# Patient Record
Sex: Female | Born: 2000
Health system: Southern US, Community
[De-identification: ages and names within clinical notes are randomized; demographics above are authoritative.]

## PROBLEM LIST (undated history)

## (undated) ENCOUNTER — Ambulatory Visit (HOSPITAL_COMMUNITY): Admission: EM | Payer: Self-pay

## (undated) DIAGNOSIS — S83511A Sprain of anterior cruciate ligament of right knee, initial encounter: Secondary | ICD-10-CM

## (undated) DIAGNOSIS — S83519A Sprain of anterior cruciate ligament of unspecified knee, initial encounter: Secondary | ICD-10-CM

---
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
## 2020-06-15 ENCOUNTER — Other Ambulatory Visit: Payer: Self-pay

## 2020-06-15 ENCOUNTER — Emergency Department (HOSPITAL_COMMUNITY)
Admission: EM | Admit: 2020-06-15 | Discharge: 2020-06-16 | Disposition: A | Discharge status: Home / Self Care | Payer: Medicaid Other | Attending: Emergency Medicine | Admitting: Emergency Medicine

## 2020-06-15 DIAGNOSIS — Z5321 Procedure and treatment not carried out due to patient leaving prior to being seen by health care provider: Secondary | ICD-10-CM | POA: Insufficient documentation

## 2020-06-15 DIAGNOSIS — R519 Headache, unspecified: Secondary | ICD-10-CM | POA: Diagnosis not present

## 2020-06-15 DIAGNOSIS — R1110 Vomiting, unspecified: Secondary | ICD-10-CM | POA: Diagnosis not present

## 2020-06-15 LAB — URINALYSIS, ROUTINE W REFLEX MICROSCOPIC
Bilirubin Urine: NEGATIVE
Glucose, UA: NEGATIVE mg/dL
Hgb urine dipstick: NEGATIVE
Ketones, ur: 80 mg/dL — AB
Leukocytes,Ua: NEGATIVE
Nitrite: NEGATIVE
Protein, ur: 30 mg/dL — AB
Specific Gravity, Urine: 1.033 — ABNORMAL HIGH (ref 1.005–1.030)
pH: 5 (ref 5.0–8.0)

## 2020-06-15 LAB — LIPASE, BLOOD: Lipase: 20 U/L (ref 11–51)

## 2020-06-15 LAB — COMPREHENSIVE METABOLIC PANEL
ALT: 12 U/L (ref 0–44)
AST: 15 U/L (ref 15–41)
Albumin: 4.1 g/dL (ref 3.5–5.0)
Alkaline Phosphatase: 46 U/L (ref 38–126)
Anion gap: 12 (ref 5–15)
BUN: 14 mg/dL (ref 6–20)
CO2: 21 mmol/L — ABNORMAL LOW (ref 22–32)
Calcium: 9.2 mg/dL (ref 8.9–10.3)
Chloride: 100 mmol/L (ref 98–111)
Creatinine, Ser: 0.91 mg/dL (ref 0.44–1.00)
GFR, Estimated: >60 mL/min (ref >60)
Glucose, Bld: 93 mg/dL (ref 70–99)
Potassium: 3.3 mmol/L — ABNORMAL LOW (ref 3.5–5.1)
Sodium: 133 mmol/L — ABNORMAL LOW (ref 135–145)
Total Bilirubin: 0.6 mg/dL (ref 0.3–1.2)
Total Protein: 6.8 g/dL (ref 6.5–8.1)

## 2020-06-15 LAB — CBC
HCT: 40.8 % (ref 36.0–46.0)
Hemoglobin: 13.7 g/dL (ref 12.0–15.0)
MCH: 32.7 pg (ref 26.0–34.0)
MCHC: 33.6 g/dL (ref 30.0–36.0)
MCV: 97.4 fL (ref 80.0–100.0)
Platelets: 196 10*3/uL (ref 150–400)
RBC: 4.19 MIL/uL (ref 3.87–5.11)
RDW: 11.5 % (ref 11.5–15.5)
WBC: 3.8 10*3/uL — ABNORMAL LOW (ref 4.0–10.5)
nRBC: 0 % (ref 0.0–0.2)

## 2020-06-15 LAB — I-STAT BETA HCG BLOOD, ED (MC, WL, AP ONLY): I-stat hCG, quantitative: <5 m[IU]/mL (ref <5)

## 2020-06-15 NOTE — ED Triage Notes (Signed)
Pt presents to ED POV. Pt c/o emesis and HA began this am. Pt denies any other s/s. Denies pain. LMP was 1w ago

## 2020-06-16 NOTE — ED Notes (Signed)
Pt called for vitals x3 no response 

## 2021-02-18 ENCOUNTER — Emergency Department (HOSPITAL_COMMUNITY): Payer: Medicaid Other

## 2021-02-18 ENCOUNTER — Emergency Department (HOSPITAL_COMMUNITY)
Admission: EM | Admit: 2021-02-18 | Discharge: 2021-02-18 | Disposition: A | Discharge status: Home / Self Care | Payer: Medicaid Other | Attending: Emergency Medicine | Admitting: Emergency Medicine

## 2021-02-18 ENCOUNTER — Other Ambulatory Visit: Payer: Self-pay

## 2021-02-18 ENCOUNTER — Encounter (HOSPITAL_COMMUNITY): Payer: Self-pay | Admitting: Emergency Medicine

## 2021-02-18 DIAGNOSIS — N12 Tubulo-interstitial nephritis, not specified as acute or chronic: Secondary | ICD-10-CM | POA: Diagnosis not present

## 2021-02-18 DIAGNOSIS — R1011 Right upper quadrant pain: Secondary | ICD-10-CM | POA: Diagnosis present

## 2021-02-18 LAB — COMPREHENSIVE METABOLIC PANEL
ALT: 10 U/L (ref 0–44)
AST: 13 U/L — ABNORMAL LOW (ref 15–41)
Albumin: 3.6 g/dL (ref 3.5–5.0)
Alkaline Phosphatase: 53 U/L (ref 38–126)
Anion gap: 7 (ref 5–15)
BUN: 15 mg/dL (ref 6–20)
CO2: 23 mmol/L (ref 22–32)
Calcium: 9.5 mg/dL (ref 8.9–10.3)
Chloride: 107 mmol/L (ref 98–111)
Creatinine, Ser: 0.78 mg/dL (ref 0.44–1.00)
GFR, Estimated: >60 mL/min (ref >60)
Glucose, Bld: 92 mg/dL (ref 70–99)
Potassium: 4.3 mmol/L (ref 3.5–5.1)
Sodium: 137 mmol/L (ref 135–145)
Total Bilirubin: 0.5 mg/dL (ref 0.3–1.2)
Total Protein: 6.7 g/dL (ref 6.5–8.1)

## 2021-02-18 LAB — CBC
HCT: 41.3 % (ref 36.0–46.0)
Hemoglobin: 13.1 g/dL (ref 12.0–15.0)
MCH: 31 pg (ref 26.0–34.0)
MCHC: 31.7 g/dL (ref 30.0–36.0)
MCV: 97.6 fL (ref 80.0–100.0)
Platelets: 256 10*3/uL (ref 150–400)
RBC: 4.23 MIL/uL (ref 3.87–5.11)
RDW: 12.1 % (ref 11.5–15.5)
WBC: 8.3 10*3/uL (ref 4.0–10.5)
nRBC: 0 % (ref 0.0–0.2)

## 2021-02-18 LAB — URINALYSIS, ROUTINE W REFLEX MICROSCOPIC
Bacteria, UA: NONE SEEN
Bilirubin Urine: NEGATIVE
Glucose, UA: NEGATIVE mg/dL
Ketones, ur: NEGATIVE mg/dL
Nitrite: NEGATIVE
Protein, ur: NEGATIVE mg/dL
Specific Gravity, Urine: 1.01 (ref 1.005–1.030)
WBC, UA: >50 WBC/hpf — ABNORMAL HIGH (ref 0–5)
pH: 8 (ref 5.0–8.0)

## 2021-02-18 LAB — I-STAT BETA HCG BLOOD, ED (MC, WL, AP ONLY): I-stat hCG, quantitative: <5 m[IU]/mL (ref <5)

## 2021-02-18 LAB — LIPASE, BLOOD: Lipase: 23 U/L (ref 11–51)

## 2021-02-18 MED ORDER — CEPHALEXIN 500 MG PO CAPS: 500.0000 mg | ORAL_CAPSULE | Freq: Three times a day (TID) | ORAL | 0 refills | Status: AC

## 2021-02-18 MED ORDER — ACETAMINOPHEN 325 MG PO TABS: 650.0000 mg | ORAL_TABLET | Freq: Four times a day (QID) | ORAL | 0 refills | Status: DC | PRN

## 2021-02-18 MED ORDER — CEPHALEXIN 250 MG PO CAPS
500.0000 mg | ORAL_CAPSULE | Freq: Once | ORAL | Status: AC
  Administered 2021-02-18: 500 mg via ORAL
  Filled 2021-02-18: qty 2

## 2021-02-18 MED ORDER — IOHEXOL 350 MG/ML SOLN
80.0000 mL | Freq: Once | INTRAVENOUS | Status: AC | PRN
  Administered 2021-02-18: 80 mL via INTRAVENOUS

## 2021-02-18 MED ORDER — SODIUM CHLORIDE 0.9 % IV BOLUS
1000.0000 mL | Freq: Once | INTRAVENOUS | Status: AC
  Administered 2021-02-18: 1000 mL via INTRAVENOUS

## 2021-02-18 MED ORDER — KETOROLAC TROMETHAMINE 15 MG/ML IJ SOLN
15.0000 mg | Freq: Once | INTRAMUSCULAR | Status: AC
  Administered 2021-02-18: 15 mg via INTRAVENOUS
  Filled 2021-02-18: qty 1

## 2021-02-18 NOTE — ED Triage Notes (Signed)
C/o RLQ pain since Friday.  Pt crying.  Denies nausea, vomiting, and diarrhea.

## 2021-02-18 NOTE — ED Notes (Signed)
Patient transported to CT 

## 2021-02-18 NOTE — ED Provider Notes (Signed)
[facility] EMERGENCY DEPARTMENT Provider Note   CSN: [id number] Arrival date & time: 02/18/21  3149     History No chief complaint on file.   Jasmin [name] is a 20 y.o. female.  Pt is a 20 yo female presenting for abdominal pain. Pt admits to RLQ abdominal pain with radiation to the right flank region x 2.5 days, worsening, severe, and associated with nausea and vomiting. Denies fevers or chills. Admits to dysuria and increased frequency.   The history is provided by the patient. No language interpreter was used.  Abdominal Pain Pain location:  RUQ Pain quality: aching   Pain radiates to:  R flank Chronicity:  New Associated symptoms: dysuria, nausea and vomiting   Associated symptoms: no chest pain, no chills, no cough, no fever, no hematuria, no shortness of breath and no sore throat       Past Medical History:  Diagnosis Date   ACL tear 03/2015   right   Complete tear of right ACL 04/09/2015    Patient Active Problem List   Diagnosis Date Noted   Complete tear of right ACL 04/09/2015    Past Surgical History:  Procedure Laterality Date   KNEE ARTHROSCOPY WITH ANTERIOR CRUCIATE LIGAMENT (ACL) REPAIR WITH HAMSTRING GRAFT Right 04/12/2015   Procedure: RIGHT KNEE ARTHROSCOPY WITH ANTERIOR CRUCIATE LIGAMENT (ACL) REPAIR WITH HAMSTRING AUTOGRAFT;  Surgeon: Salvatore Marvel, MD;  Location: [facility];  Service: Orthopedics;  Laterality: Right;  ANESTHESIA:  GENERAL, PRE/POST OP FEMORAL NERVE     OB History   No obstetric history on file.     No family history on file.  Social History   Tobacco Use   Smoking status: Never   Smokeless tobacco: Never  Vaping Use   Vaping Use: Never used  Substance Use Topics   Alcohol use: No   Drug use: No    Home Medications Prior to Admission medications   Medication Sig Start Date End Date Taking? Authorizing Provider  clindamycin (CLEOCIN) 150 MG capsule Take 1 capsule (150 mg total)  every 6 (six) hours by mouth. 04/22/17   Blane Ohara, MD  diazepam (VALIUM) 2 MG tablet Take 1 tablet (2 mg total) by mouth every 8 (eight) hours as needed for muscle spasms. 04/12/15   Shepperson, Kirstin, PA-C  oxyCODONE (ROXICODONE) 5 MG immediate release tablet 1-2 tablets every 4-6 hrs as needed for pain 04/12/15   Shepperson, Kirstin, PA-C    Allergies    Patient has no known allergies.  Review of Systems   Review of Systems  Constitutional:  Negative for chills and fever.  HENT:  Negative for ear pain and sore throat.   Eyes:  Negative for pain and visual disturbance.  Respiratory:  Negative for cough and shortness of breath.   Cardiovascular:  Negative for chest pain and palpitations.  Gastrointestinal:  Positive for abdominal pain, nausea and vomiting.  Genitourinary:  Positive for dysuria, flank pain and frequency. Negative for hematuria.  Musculoskeletal:  Negative for arthralgias and back pain.  Skin:  Negative for color change and rash.  Neurological:  Negative for seizures and syncope.  All other systems reviewed and are negative.  Physical Exam Updated Vital Signs BP 94/60 (BP Location: Right Arm)   Pulse (!) 110   Temp 98.1 F (36.7 C) (Oral)   Resp 16   Ht 5\' 6"  (1.676 m)   Wt 51.7 kg   LMP 01/28/2021   SpO2 100%   BMI 18.40 kg/m  Physical Exam Vitals and nursing note reviewed.  Constitutional:      General: She is not in acute distress.    Appearance: She is well-developed.  HENT:     Head: Normocephalic and atraumatic.  Eyes:     Conjunctiva/sclera: Conjunctivae normal.  Cardiovascular:     Rate and Rhythm: Normal rate and regular rhythm.     Heart sounds: No murmur heard. Pulmonary:     Effort: Pulmonary effort is normal. No respiratory distress.     Breath sounds: Normal breath sounds.  Abdominal:     Palpations: Abdomen is soft.     Tenderness: There is no abdominal tenderness.  Musculoskeletal:     Cervical back: Neck supple.  Skin:     General: Skin is warm and dry.  Neurological:     Mental Status: She is alert.    ED Results / Procedures / Treatments   Labs (all labs ordered are listed, but only abnormal results are displayed) Labs Reviewed  LIPASE, BLOOD  COMPREHENSIVE METABOLIC PANEL  CBC  URINALYSIS, ROUTINE W REFLEX MICROSCOPIC  I-STAT BETA HCG BLOOD, ED ([facility], [facility], [facility] ONLY)    EKG None  Radiology No results found.  Procedures Procedures   Medications Ordered in ED Medications - No data to display  ED Course  I have reviewed the triage vital signs and the nursing notes.  Pertinent labs & imaging results that were available during my care of the patient were reviewed by me and considered in my medical decision making (see chart for details).    MDM Rules/Calculators/A&P                           10:29 AM 20 yo female presenting for dysuria, increased urinary frequency, RLQ abdominal pain, and right flank pain. Pt is Aox3, no acute distress, afebrile, with stable vitals. Physical exam demonstrates soft non-peritoneal abdomen.   UA demonstrates UTI with CT evidence of hydronephrosis likely secondary to pyelonephritis. No signs/symptoms of sepsis. Pt able to urinate without difficulty. Keflex given in ED and sent to pharmacy.   Negative pregnancy test CT demonstrates no appendicitis.   Patient in no distress and overall condition improved here in the ED. Detailed discussions were had with the patient regarding current findings, and need for close f/u with PCP or on call doctor. The patient has been instructed to return immediately if the symptoms worsen in any way for re-evaluation. Patient verbalized understanding and is in agreement with current care plan. All questions answered prior to discharge.       Final Clinical Impression(s) / ED Diagnoses Final diagnoses:  Pyelonephritis    Rx / DC Orders ED Discharge Orders     None        Franne Forts, DO 02/18/21 1258

## 2021-07-29 ENCOUNTER — Encounter (HOSPITAL_COMMUNITY): Payer: Self-pay

## 2021-07-29 ENCOUNTER — Other Ambulatory Visit: Payer: Self-pay

## 2021-07-29 ENCOUNTER — Ambulatory Visit (HOSPITAL_COMMUNITY)
Admission: EM | Admit: 2021-07-29 | Discharge: 2021-07-29 | Disposition: A | Discharge status: Home / Self Care | Payer: Medicaid Other | Attending: Emergency Medicine | Admitting: Emergency Medicine

## 2021-07-29 DIAGNOSIS — J038 Acute tonsillitis due to other specified organisms: Secondary | ICD-10-CM

## 2021-07-29 DIAGNOSIS — B9689 Other specified bacterial agents as the cause of diseases classified elsewhere: Secondary | ICD-10-CM | POA: Diagnosis not present

## 2021-07-29 LAB — POCT RAPID STREP A, ED / UC: Streptococcus, Group A Screen (Direct): NEGATIVE

## 2021-07-29 MED ORDER — CEFDINIR 300 MG PO CAPS: 300.0000 mg | ORAL_CAPSULE | Freq: Two times a day (BID) | ORAL | 0 refills | Status: AC

## 2021-07-29 NOTE — Discharge Instructions (Addendum)
Based on my physical exam today and the history that you provided, I believe that you have bacterial pharyngitis.  Bacterial pharyngitis is most commonly caused by bacteria called group A Streptococcus but there are many other culprits.   Your rapid strep test today is negative, per protocol, throat culture will be performed and the results should be available in the next 3 to 5 days.  The rapid strep test that we performed in the office only catches 40% of known cases of group A streptococcal pharyngitis.  Additionally, and unfortunately, the throat culture that we perform here in the urgent care only evaluates for group A strep and not any of the other bacteria  that are known to cause bacterial pharyngitis.      Because you have significant swelling of both tonsils, significant redness of both tonsils and white patches on both tonsils, I have prescribed Cefdinir 300 mg twice daily for 10 days to treat you for bacterial pharyngitis.  Cefdinir covers group A strep along with other known causative organisms.  Please take this antibiotic as prescribed and do not skip any doses.  Once you have been on antibiotics for a full 24 hours, you are no longer considered contagious.       If you receive a phone call telling you that your throat culture is negative, I still strongly recommend that you complete your entire prescription of antibiotics regardless of the result of your throat culture, particularly if you begin to feel better within the first 48 hours of therapy.  The throat culture we perform here at urgent care only tests for group A strep and not any of the other bacteria that can also cause bacterial pharyngitis.  Please follow-up with your primary care provider in the next week to ten days for repeat evaluation if you have not had complete resolution of your symptoms.      Please see the list below for recommended medications, dosages and frequencies to provide relief of your current symptoms:      Ibuprofen  (Advil, Motrin): This is a good anti-inflammatory medication which addresses aches, pains and inflammation of the upper airways that causes sinus and nasal congestion as well as in the lower airways which makes your cough feel tight and sometimes burn.  I recommend that you take between 400 to 600 mg every 6-8 hours as needed.      Chloraseptic Throat Spray: Spray 5 sprays into affected area every 2 hours, hold for 15 seconds and either swallow or spit it out.  This is a excellent numbing medication because it is a spray, you can put it right where you needed and so sucking on a lozenge and numbing your entire mouth.      Please remain home from work, school, public places until you have been fever free for 24 hours without the use of antifever medications such as Tylenol or ibuprofen.    Please follow-up within the next 3 to 5 days either with your primary care provider or urgent care if your symptoms do not resolve.  If you do not have a primary care provider, we will assist you in finding one.

## 2021-07-29 NOTE — ED Triage Notes (Signed)
Pt presents with c/o sore throat and cough X 2 days.    Pt states today she has been taking coughs drops and states she felt her throat was closing.

## 2021-07-29 NOTE — ED Provider Notes (Signed)
[facility]    CSN: [id number] Arrival date & time: 07/29/21  1643    HISTORY   Chief Complaint  Patient presents with   Sore Throat   Cough   HPI Jasmin [name] is a 21 y.o. female. Patient presents with a 2-day history of sore throat and cough.  Patient states she has been using cough drops with little relief, states she feels like her throat is swelling.  Patient is afebrile on arrival today with normal blood pressure, normal heart rate, normal pulse and normal oxygen saturation.   Past Medical History:  Diagnosis Date   ACL tear 03/2015   right   Complete tear of right ACL 04/09/2015   Patient Active Problem List   Diagnosis Date Noted   Complete tear of right ACL 04/09/2015   Past Surgical History:  Procedure Laterality Date   KNEE ARTHROSCOPY WITH ANTERIOR CRUCIATE LIGAMENT (ACL) REPAIR WITH HAMSTRING GRAFT Right 04/12/2015   Procedure: RIGHT KNEE ARTHROSCOPY WITH ANTERIOR CRUCIATE LIGAMENT (ACL) REPAIR WITH HAMSTRING AUTOGRAFT;  Surgeon: Salvatore Marvel, MD;  Location: [facility];  Service: Orthopedics;  Laterality: Right;  ANESTHESIA:  GENERAL, PRE/POST OP FEMORAL NERVE   OB History   No obstetric history on file.    Home Medications    Prior to Admission medications   Medication Sig Start Date End Date Taking? Authorizing Provider   Family History History reviewed. No pertinent family history. Social History Social History   Tobacco Use   Smoking status: Never   Smokeless tobacco: Never  Vaping Use   Vaping Use: Never used  Substance Use Topics   Alcohol use: No   Drug use: No   Allergies   Patient has no known allergies.  Review of Systems Review of Systems Pertinent findings noted in history of present illness.   Physical Exam Triage Vital Signs ED Triage Vitals  Enc Vitals Group     BP 04/13/21 0827 (!) 147/82     Pulse Rate 04/13/21 0827 72     Resp 04/13/21 0827 18     Temp 04/13/21 0827 98.3 F (36.8 C)      Temp Source 04/13/21 0827 Oral     SpO2 04/13/21 0827 98 %     Weight --      Height --      Head Circumference --      Peak Flow --      Pain Score 04/13/21 0826 5     Pain Loc --      Pain Edu? --      Excl. in GC? --   No data found.  Updated Vital Signs BP 105/73 (BP Location: Left Arm)    Pulse 70    Temp 99.3 F (37.4 C) (Oral)    Resp 17    LMP 07/24/2021 (Exact Date)    SpO2 96%   Physical Exam  Visual Acuity Right Eye Distance:   Left Eye Distance:   Bilateral Distance:    Right Eye Near:   Left Eye Near:    Bilateral Near:     UC Couse / Diagnostics / Procedures:    EKG  Radiology No results found.  Procedures Procedures (including critical care time)  UC Diagnoses / Final Clinical Impressions(s)   I have reviewed the triage vital signs and the nursing notes.  Pertinent labs & imaging results that were available during my care of the patient were reviewed by me and considered in my medical decision making (see chart  for details).   Final diagnoses:  Acute bacterial tonsillitis  Patient will be treated empirically for presumed bacterial tonsillitis despite negative rapid strep test.  Throat culture will be performed per protocol.  Patient advised that if she is feeling better after few days of antibiotics, she should continue full 10-day course.  Return precautions advised.  ED Prescriptions     Medication Sig Dispense Auth. Provider   cefdinir (OMNICEF) 300 MG capsule Take 1 capsule (300 mg total) by mouth 2 (two) times daily for 10 days. 20 capsule Theadora Rama Scales, PA-C      PDMP not reviewed this encounter.  Pending results:  Labs Reviewed  CULTURE, GROUP A STREP [facility])  POCT RAPID STREP A, ED / UC    Medications Ordered in UC: Medications - No data to display  Disposition Upon Discharge:  Condition: stable for discharge home Home: take medications as prescribed; routine discharge instructions as discussed; follow up as  advised.  Patient presented with an acute illness with associated systemic symptoms and significant discomfort requiring urgent management. In my opinion, this is a condition that a prudent lay person (someone who possesses an average knowledge of health and medicine) may potentially expect to result in complications if not addressed urgently such as respiratory distress, impairment of bodily function or dysfunction of bodily organs.   Routine symptom specific, illness specific and/or disease specific instructions were discussed with the patient and/or caregiver at length.   As such, the patient has been evaluated and assessed, work-up was performed and treatment was provided in alignment with urgent care protocols and evidence based medicine.  Patient/parent/caregiver has been advised that the patient may require follow up for further testing and treatment if the symptoms continue in spite of treatment, as clinically indicated and appropriate.  If the patient was tested for COVID-19, Influenza and/or RSV, then the patient/parent/guardian was advised to isolate at home pending the results of his/her diagnostic coronavirus test and potentially longer if theyre positive. I have also advised pt that if his/her COVID-19 test returns positive, it's recommended to self-isolate for at least 10 days after symptoms first appeared AND until fever-free for 24 hours without fever reducer AND other symptoms have improved or resolved. Discussed self-isolation recommendations as well as instructions for household member/close contacts as per the [facility] [place] [facility], and also gave patient the COVID packet with this information.  Patient/parent/caregiver has been advised to return to the [facility] or PCP in 3-5 days if no better; to PCP or the Emergency Department if new signs and symptoms develop, or if the current signs or symptoms continue to change or worsen for further workup, evaluation and treatment as clinically indicated  and appropriate  The patient will follow up with their current PCP if and as advised. If the patient does not currently have a PCP we will assist them in obtaining one.   The patient may need specialty follow up if the symptoms continue, in spite of conservative treatment and management, for further workup, evaluation, consultation and treatment as clinically indicated and appropriate.  Patient/parent/caregiver verbalized understanding and agreement of plan as discussed.  All questions were addressed during visit.  Please see discharge instructions below for further details of plan.  Discharge Instructions:   Discharge Instructions      Based on my physical exam today and the history that you provided, I believe that you have bacterial pharyngitis.  Bacterial pharyngitis is most commonly caused by bacteria called group A Streptococcus but there are  many other culprits.   Your rapid strep test today is negative, per protocol, throat culture will be performed and the results should be available in the next 3 to 5 days.  The rapid strep test that we performed in the office only catches 40% of known cases of group A streptococcal pharyngitis.  Additionally, and unfortunately, the throat culture that we perform here in the urgent care only evaluates for group A strep and not any of the other bacteria  that are known to cause bacterial pharyngitis.      Because you have significant swelling of both tonsils, significant redness of both tonsils and white patches on both tonsils, I have prescribed Cefdinir 300 mg twice daily for 10 days to treat you for bacterial pharyngitis.  Cefdinir covers group A strep along with other known causative organisms.  Please take this antibiotic as prescribed and do not skip any doses.  Once you have been on antibiotics for a full 24 hours, you are no longer considered contagious.       If you receive a phone call telling you that your throat culture is negative, I still  strongly recommend that you complete your entire prescription of antibiotics regardless of the result of your throat culture, particularly if you begin to feel better within the first 48 hours of therapy.  The throat culture we perform here at urgent care only tests for group A strep and not any of the other bacteria that can also cause bacterial pharyngitis.  Please follow-up with your primary care provider in the next week to ten days for repeat evaluation if you have not had complete resolution of your symptoms.      Please see the list below for recommended medications, dosages and frequencies to provide relief of your current symptoms:     Ibuprofen  (Advil, Motrin): This is a good anti-inflammatory medication which addresses aches, pains and inflammation of the upper airways that causes sinus and nasal congestion as well as in the lower airways which makes your cough feel tight and sometimes burn.  I recommend that you take between 400 to 600 mg every 6-8 hours as needed.      Chloraseptic Throat Spray: Spray 5 sprays into affected area every 2 hours, hold for 15 seconds and either swallow or spit it out.  This is a excellent numbing medication because it is a spray, you can put it right where you needed and so sucking on a lozenge and numbing your entire mouth.      Please remain home from work, school, public places until you have been fever free for 24 hours without the use of antifever medications such as Tylenol or ibuprofen.    Please follow-up within the next 3 to 5 days either with your primary care provider or urgent care if your symptoms do not resolve.  If you do not have a primary care provider, we will assist you in finding one.     This office note has been dictated using Teaching laboratory technician.  Unfortunately, and despite my best efforts, this method of dictation can sometimes lead to occasional typographical or grammatical errors.  I apologize in advance if this  occurs.     Theadora Rama Scales, PA-C 07/29/21 1840

## 2021-08-01 LAB — CULTURE, GROUP A STREP (THRC)

## 2021-11-13 ENCOUNTER — Ambulatory Visit (HOSPITAL_COMMUNITY)
Admission: EM | Admit: 2021-11-13 | Discharge: 2021-11-13 | Disposition: A | Discharge status: Home / Self Care | Payer: Medicaid Other | Attending: Internal Medicine | Admitting: Internal Medicine

## 2021-11-13 ENCOUNTER — Encounter (HOSPITAL_COMMUNITY): Payer: Self-pay | Admitting: Emergency Medicine

## 2021-11-13 DIAGNOSIS — J029 Acute pharyngitis, unspecified: Secondary | ICD-10-CM | POA: Diagnosis present

## 2021-11-13 LAB — POCT RAPID STREP A, ED / UC: Streptococcus, Group A Screen (Direct): NEGATIVE

## 2021-11-13 MED ORDER — LIDOCAINE VISCOUS HCL 2 % MT SOLN: 15.0000 mL | OROMUCOSAL | 0 refills | Status: DC | PRN

## 2021-11-13 NOTE — ED Triage Notes (Signed)
Pt c/o sore throat and headache since yesterday. Had strep 2 months ago and symptoms feel the same

## 2021-11-13 NOTE — ED Provider Notes (Signed)
[facility]    CSN: [id number] Arrival date & time: 11/13/21  1122      History   Chief Complaint Chief Complaint  Patient presents with   Sore Throat    HPI Jasmin [name] is a 21 y.o. female.   Patient presents with sore throat and generalized headache beginning 1 day ago.  No known sick contacts.  Tolerating food and liquids.  Has attempted use of Tylenol which has been minimally helpful.  Denies fever, chills, body aches, congestion, ear pain, cough, shortness of breath, wheezing.   Past Medical History:  Diagnosis Date   ACL tear 03/2015   right   Complete tear of right ACL 04/09/2015    Patient Active Problem List   Diagnosis Date Noted   Complete tear of right ACL 04/09/2015    Past Surgical History:  Procedure Laterality Date   KNEE ARTHROSCOPY WITH ANTERIOR CRUCIATE LIGAMENT (ACL) REPAIR WITH HAMSTRING GRAFT Right 04/12/2015   Procedure: RIGHT KNEE ARTHROSCOPY WITH ANTERIOR CRUCIATE LIGAMENT (ACL) REPAIR WITH HAMSTRING AUTOGRAFT;  Surgeon: Salvatore Marvel, MD;  Location: [facility];  Service: Orthopedics;  Laterality: Right;  ANESTHESIA:  GENERAL, PRE/POST OP FEMORAL NERVE    OB History   No obstetric history on file.      Home Medications    Prior to Admission medications   Not on File    Family History No family history on file.  Social History Social History   Tobacco Use   Smoking status: Never   Smokeless tobacco: Never  Vaping Use   Vaping Use: Never used  Substance Use Topics   Alcohol use: No   Drug use: No     Allergies   Patient has no known allergies.   Review of Systems Review of Systems  Constitutional: Negative.   HENT:  Positive for sore throat. Negative for congestion, dental problem, drooling, ear discharge, ear pain, facial swelling, hearing loss, mouth sores, nosebleeds, postnasal drip, rhinorrhea, sinus pressure, sinus pain, sneezing, tinnitus, trouble swallowing and voice change.    Respiratory: Negative.    Cardiovascular: Negative.   Gastrointestinal: Negative.   Skin: Negative.   Neurological:  Positive for headaches. Negative for dizziness, tremors, seizures, syncope, facial asymmetry, speech difficulty, weakness, light-headedness and numbness.    Physical Exam Triage Vital Signs ED Triage Vitals  Enc Vitals Group     BP 11/13/21 1146 102/73     Pulse Rate 11/13/21 1146 68     Resp 11/13/21 1146 16     Temp 11/13/21 1146 98.7 F (37.1 C)     Temp Source 11/13/21 1146 Oral     SpO2 11/13/21 1146 99 %     Weight --      Height --      Head Circumference --      Peak Flow --      Pain Score 11/13/21 1144 6     Pain Loc --      Pain Edu? --      Excl. in GC? --    No data found.  Updated Vital Signs BP 102/73 (BP Location: Left Arm)   Pulse 68   Temp 98.7 F (37.1 C) (Oral)   Resp 16   LMP 11/13/2021   SpO2 99%   Visual Acuity Right Eye Distance:   Left Eye Distance:   Bilateral Distance:    Right Eye Near:   Left Eye Near:    Bilateral Near:     Physical Exam Constitutional:  Appearance: She is well-developed.  HENT:     Head: Normocephalic.     Right Ear: Tympanic membrane and ear canal normal.     Left Ear: Tympanic membrane and ear canal normal.     Nose: No congestion or rhinorrhea.     Mouth/Throat:     Mouth: Mucous membranes are moist.     Pharynx: Posterior oropharyngeal erythema present.     Tonsils: No tonsillar exudate. 0 on the right. 0 on the left.  Cardiovascular:     Rate and Rhythm: Normal rate and regular rhythm.     Heart sounds: Normal heart sounds.  Pulmonary:     Effort: Pulmonary effort is normal.     Breath sounds: Normal breath sounds.  Musculoskeletal:     Cervical back: Normal range of motion.  Lymphadenopathy:     Cervical: Cervical adenopathy present.  Skin:    General: Skin is warm and dry.  Neurological:     General: No focal deficit present.     Mental Status: She is alert and oriented  to person, place, and time.  Psychiatric:        Mood and Affect: Mood normal.        Behavior: Behavior normal.     UC Treatments / Results  Labs (all labs ordered are listed, but only abnormal results are displayed) Labs Reviewed  CULTURE, GROUP A STREP [facility])  POCT RAPID STREP A, ED / UC    EKG   Radiology No results found.  Procedures Procedures (including critical care time)  Medications Ordered in UC Medications - No data to display  Initial Impression / Assessment and Plan / UC Course  I have reviewed the triage vital signs and the nursing notes.  Pertinent labs & imaging results that were available during my care of the patient were reviewed by me and considered in my medical decision making (see chart for details).  Viral pharyngitis  Rapid strep negative, sent for culture, discussed findings with patient, vital signs are stable and patient is in no signs of distress, prescribed viscous lidocaine for management, may attempt use of additional supportive care as needed, may follow-up with urgent care as needed Final Clinical Impressions(s) / UC Diagnoses   Final diagnoses:  None   Discharge Instructions   None    ED Prescriptions   None    PDMP not reviewed this encounter.   Valinda Hoar, NP 11/13/21 1257

## 2021-11-13 NOTE — Discharge Instructions (Signed)
Your rapid strep test today was negative, your throat sample has been sent to the lab to see if it will grow bacteria, if this occurs you will be notified and antibiotic be sent in for use  In the meantime we must treat this as a viral symptom and manage the symptoms  You may gargle and spit lidocaine solution every 4 hours as needed for temporary relief of your sore throat  May use ibuprofen every 6 hours as needed in addition to Tylenol for additional comfort  May attempt salt water gargles, throat lozenges, warm liquids teaspoons of honey as needed for additional supportive care  You may follow-up at urgent care as needed

## 2021-11-16 LAB — CULTURE, GROUP A STREP (THRC)

## 2021-12-17 ENCOUNTER — Encounter (HOSPITAL_COMMUNITY): Payer: Self-pay | Admitting: Emergency Medicine

## 2021-12-17 ENCOUNTER — Ambulatory Visit (HOSPITAL_COMMUNITY)
Admission: EM | Admit: 2021-12-17 | Discharge: 2021-12-17 | Disposition: A | Discharge status: Home / Self Care | Payer: Medicaid Other | Attending: Internal Medicine | Admitting: Internal Medicine

## 2021-12-17 DIAGNOSIS — Z79899 Other long term (current) drug therapy: Secondary | ICD-10-CM | POA: Diagnosis not present

## 2021-12-17 DIAGNOSIS — Z20822 Contact with and (suspected) exposure to covid-19: Secondary | ICD-10-CM | POA: Diagnosis not present

## 2021-12-17 DIAGNOSIS — R059 Cough, unspecified: Secondary | ICD-10-CM | POA: Insufficient documentation

## 2021-12-17 DIAGNOSIS — J069 Acute upper respiratory infection, unspecified: Secondary | ICD-10-CM | POA: Diagnosis present

## 2021-12-17 MED ORDER — IBUPROFEN 600 MG PO TABS: 600.0000 mg | ORAL_TABLET | Freq: Four times a day (QID) | ORAL | 0 refills | Status: DC | PRN

## 2021-12-17 MED ORDER — IBUPROFEN 800 MG PO TABS
800.0000 mg | ORAL_TABLET | Freq: Once | ORAL | Status: AC
  Administered 2021-12-17: 800 mg via ORAL

## 2021-12-17 MED ORDER — IBUPROFEN 800 MG PO TABS
ORAL_TABLET | ORAL | Status: AC
  Filled 2021-12-17: qty 1

## 2021-12-17 MED ORDER — GUAIFENESIN ER 1200 MG PO TB12: 1200.0000 mg | ORAL_TABLET | Freq: Two times a day (BID) | ORAL | 0 refills | Status: DC

## 2021-12-17 MED ORDER — BENZONATATE 100 MG PO CAPS: 100.0000 mg | ORAL_CAPSULE | Freq: Three times a day (TID) | ORAL | 0 refills | Status: DC

## 2021-12-17 MED ORDER — ACETAMINOPHEN 500 MG PO TABS: 1000.0000 mg | ORAL_TABLET | Freq: Four times a day (QID) | ORAL | 0 refills | Status: DC | PRN

## 2021-12-17 NOTE — Discharge Instructions (Signed)
You have a viral upper respiratory infection.  Start taking Tylenol/ibuprofen every 6 hours as needed for fever/chills, inflammation, and headache/throat pain.  Your next dose of ibuprofen can be tonight at 11 PM if needed since you were given 800 mg of ibuprofen in the clinic today for your sore throat and headache.  Take guaifenesin twice daily to thin your mucus so that you are able to cough it up and blow it out of your nose easier.  Increase your water intake to at least 64 ounces of water per day to stay well-hydrated while taking this medication.  You may take Tessalon Perles every 8 hours as needed for cough.  This medicine will not make you sleepy, but will help with your cough.  If you develop any new or worsening symptoms or do not improve in the next 2 to 3 days, please return.  If your symptoms are severe, please go to the emergency room.  Follow-up with your primary care provider for further evaluation and management of your symptoms as well as ongoing wellness visits.  I hope you feel better!

## 2021-12-17 NOTE — ED Triage Notes (Signed)
Pt c/o congestion, sore throat, headache that started yesterday. Tried taking cold and flu meds last night but didn't help.

## 2021-12-17 NOTE — ED Provider Notes (Signed)
[facility]    CSN: [id number] Arrival date & time: 12/17/21  1215      History   Chief Complaint Chief Complaint  Patient presents with   Sore Throat   Nasal Congestion   Headache    HPI Jasmin [name] is a 21 y.o. female.   Patient presents to urgent care for evaluation of her nasal congestion, cough, headache, body aches, and sore throat since yesterday.  She states that it is difficult to breathe through her nose and she is not able to expectorate much mucus.  Reports decreased appetite due to sore throat.  Throat pain is currently a 5 on a scale of 0-10.  Also reporting generalized head pain that she currently rates at a 4 on a scale of 0-10.  Denies blurry vision, decreased visual acuity, ear pain, nausea, vomiting, abdominal pain, dizziness, fever/chills, and rash.  She has attempted use of combination over-the-counter cold and flu medications without much relief.  States that she woke up this morning feeling worse than she did yesterday.  Denies known sick contacts.  Denies recent antibiotic use/illness.    Sore Throat Associated symptoms include headaches.  Headache   Past Medical History:  Diagnosis Date   ACL tear 03/2015   right   Complete tear of right ACL 04/09/2015    Patient Active Problem List   Diagnosis Date Noted   Complete tear of right ACL 04/09/2015    Past Surgical History:  Procedure Laterality Date   KNEE ARTHROSCOPY WITH ANTERIOR CRUCIATE LIGAMENT (ACL) REPAIR WITH HAMSTRING GRAFT Right 04/12/2015   Procedure: RIGHT KNEE ARTHROSCOPY WITH ANTERIOR CRUCIATE LIGAMENT (ACL) REPAIR WITH HAMSTRING AUTOGRAFT;  Surgeon: Salvatore Marvel, MD;  Location: [facility];  Service: Orthopedics;  Laterality: Right;  ANESTHESIA:  GENERAL, PRE/POST OP FEMORAL NERVE    OB History   No obstetric history on file.      Home Medications    Prior to Admission medications   Medication Sig Start Date End Date Taking? Authorizing Provider   acetaminophen (TYLENOL) 500 MG tablet Take 2 tablets (1,000 mg total) by mouth every 6 (six) hours as needed. 12/17/21  Yes Carlisle Beers, FNP  benzonatate (TESSALON) 100 MG capsule Take 1 capsule (100 mg total) by mouth every 8 (eight) hours. 12/17/21  Yes Carlisle Beers, FNP  Guaifenesin 1200 MG TB12 Take 1 tablet (1,200 mg total) by mouth in the morning and at bedtime. 12/17/21  Yes Carlisle Beers, FNP  ibuprofen (ADVIL) 600 MG tablet Take 1 tablet (600 mg total) by mouth every 6 (six) hours as needed. 12/17/21  Yes Carlisle Beers, FNP  lidocaine (XYLOCAINE) 2 % solution Use as directed 15 mLs in the mouth or throat as needed for mouth pain. 11/13/21   Valinda Hoar, NP    Family History No family history on file.  Social History Social History   Tobacco Use   Smoking status: Never   Smokeless tobacco: Never  Vaping Use   Vaping Use: Never used  Substance Use Topics   Alcohol use: No   Drug use: No     Allergies   Patient has no known allergies.   Review of Systems Review of Systems  Neurological:  Positive for headaches.  Per HPI   Physical Exam Triage Vital Signs ED Triage Vitals  Enc Vitals Group     BP 12/17/21 1339 113/79     Pulse Rate 12/17/21 1339 62     Resp 12/17/21 1339  15     Temp 12/17/21 1339 98.3 F (36.8 C)     Temp Source 12/17/21 1339 Oral     SpO2 12/17/21 1339 98 %     Weight --      Height --      Head Circumference --      Peak Flow --      Pain Score 12/17/21 1337 8     Pain Loc --      Pain Edu? --      Excl. in GC? --    No data found.  Updated Vital Signs BP 113/79 (BP Location: Right Arm)   Pulse 62   Temp 98.3 F (36.8 C) (Oral)   Resp 15   LMP 12/07/2021   SpO2 98%   Visual Acuity Right Eye Distance:   Left Eye Distance:   Bilateral Distance:    Right Eye Near:   Left Eye Near:    Bilateral Near:     Physical Exam Vitals and nursing note reviewed.  Constitutional:      Appearance:  Normal appearance. She is not ill-appearing or toxic-appearing.     Comments: Very pleasant patient sitting on exam in position of comfort table in no acute distress.   HENT:     Head: Normocephalic and atraumatic.     Right Ear: Hearing, tympanic membrane, ear canal and external ear normal.     Left Ear: Hearing, tympanic membrane, ear canal and external ear normal.     Nose: Congestion and rhinorrhea present. Rhinorrhea is clear and purulent.     Right Turbinates: Swollen.     Left Turbinates: Swollen.     Right Sinus: No maxillary sinus tenderness or frontal sinus tenderness.     Left Sinus: No maxillary sinus tenderness or frontal sinus tenderness.     Mouth/Throat:     Lips: Pink.     Mouth: Mucous membranes are moist.     Pharynx: Uvula midline. Posterior oropharyngeal erythema present.     Tonsils: No tonsillar exudate.     Comments: Mild erythema to posterior oropharynx with small amount of clear postnasal drainage visualized. Airway intact and patent. Eyes:     General: Lids are normal. Vision grossly intact. Gaze aligned appropriately.     Extraocular Movements: Extraocular movements intact.     Conjunctiva/sclera: Conjunctivae normal.  Cardiovascular:     Rate and Rhythm: Normal rate and regular rhythm.     Heart sounds: Normal heart sounds, S1 normal and S2 normal.  Pulmonary:     Effort: Pulmonary effort is normal. No respiratory distress.     Breath sounds: Normal breath sounds and air entry.  Abdominal:     General: Bowel sounds are normal.     Palpations: Abdomen is soft.     Tenderness: There is no abdominal tenderness. There is no right CVA tenderness, left CVA tenderness or guarding.  Musculoskeletal:     Cervical back: Neck supple.  Skin:    General: Skin is warm and dry.     Capillary Refill: Capillary refill takes less than 2 seconds.     Findings: No rash.  Neurological:     General: No focal deficit present.     Mental Status: She is alert and oriented  to person, place, and time. Mental status is at baseline.     Cranial Nerves: No dysarthria or facial asymmetry.     Gait: Gait is intact.  Psychiatric:        Mood and  Affect: Mood normal.        Speech: Speech normal.        Behavior: Behavior normal.        Thought Content: Thought content normal.        Judgment: Judgment normal.      UC Treatments / Results  Labs (all labs ordered are listed, but only abnormal results are displayed) Labs Reviewed  SARS CORONAVIRUS 2 (TAT 6-24 HRS)    EKG   Radiology No results found.  Procedures Procedures (including critical care time)  Medications Ordered in UC Medications  ibuprofen (ADVIL) tablet 800 mg (800 mg Oral Given 12/17/21 1447)    Initial Impression / Assessment and Plan / UC Course  I have reviewed the triage vital signs and the nursing notes.  Pertinent labs & imaging results that were available during my care of the patient were reviewed by me and considered in my medical decision making (see chart for details).  Viral URI with cough Symptomology and physical exam are consistent with viral upper respiratory infection with cough.  Deferred imaging based on stable cardiopulmonary exam and vital signs today.  Plan to treat with supportive care prescriptions for Tylenol and ibuprofen every 6 hours as needed for inflammation, pain, fever, and chills at home.  Next dose of ibuprofen may be in 8 hours due to administration of 800 mg of ibuprofen in clinic today to treat sore throat and headache pain.  COVID-19 testing is pending.  Patient to take guaifenesin twice daily to thin mucus and Tessalon Perles every 8 hours as needed for cough.  Patient to increase fluid intake to at least 64 ounces of water per day to prevent dehydration.   Discussed physical exam and available lab work findings in clinic with patient.  Counseled patient regarding appropriate use of medications and potential side effects for all medications recommended  or prescribed today. Discussed red flag signs and symptoms of worsening condition,when to call the PCP office, return to urgent care, and when to seek higher level of care in the emergency department. Patient verbalizes understanding and agreement with plan. All questions answered. Patient discharged in stable condition.  Final Clinical Impressions(s) / UC Diagnoses   Final diagnoses:  Viral URI with cough     Discharge Instructions      You have a viral upper respiratory infection.  Start taking Tylenol/ibuprofen every 6 hours as needed for fever/chills, inflammation, and headache/throat pain.  Your next dose of ibuprofen can be tonight at 11 PM if needed since you were given 800 mg of ibuprofen in the clinic today for your sore throat and headache.  Take guaifenesin twice daily to thin your mucus so that you are able to cough it up and blow it out of your nose easier.  Increase your water intake to at least 64 ounces of water per day to stay well-hydrated while taking this medication.  You may take Tessalon Perles every 8 hours as needed for cough.  This medicine will not make you sleepy, but will help with your cough.  If you develop any new or worsening symptoms or do not improve in the next 2 to 3 days, please return.  If your symptoms are severe, please go to the emergency room.  Follow-up with your primary care provider for further evaluation and management of your symptoms as well as ongoing wellness visits.  I hope you feel better!     ED Prescriptions     Medication Sig Dispense Auth.  Provider   Guaifenesin 1200 MG TB12 Take 1 tablet (1,200 mg total) by mouth in the morning and at bedtime. 14 tablet Reita May M, FNP   benzonatate (TESSALON) 100 MG capsule Take 1 capsule (100 mg total) by mouth every 8 (eight) hours. 21 capsule Reita May M, FNP   acetaminophen (TYLENOL) 500 MG tablet Take 2 tablets (1,000 mg total) by mouth every 6 (six) hours as needed. 30  tablet Reita May M, FNP   ibuprofen (ADVIL) 600 MG tablet Take 1 tablet (600 mg total) by mouth every 6 (six) hours as needed. 30 tablet Carlisle Beers, FNP      PDMP not reviewed this encounter.   Carlisle Beers, [place] 12/17/21 1456

## 2021-12-18 LAB — SARS CORONAVIRUS 2 (TAT 6-24 HRS): SARS Coronavirus 2: NEGATIVE

## 2022-01-15 ENCOUNTER — Ambulatory Visit (HOSPITAL_COMMUNITY): Payer: Self-pay

## 2022-02-28 ENCOUNTER — Ambulatory Visit: Payer: Medicaid Other

## 2022-11-27 DIAGNOSIS — R300 Dysuria: Secondary | ICD-10-CM | POA: Diagnosis not present

## 2022-11-27 DIAGNOSIS — N10 Acute tubulo-interstitial nephritis: Secondary | ICD-10-CM | POA: Diagnosis not present

## 2022-12-26 IMAGING — CT CT ABD-PELV W/ CM
2 of 4 series · 16 of 46 positions shown, 18 images · IV contrast (Omni 300)
Comparison: Abdominal ultrasound dated 10/27/2013 and CT abdomen
pelvis dated 10/26/2013.

CLINICAL DATA: Right lower quadrant pain for 2 days.

EXAM:
CT ABDOMEN AND PELVIS WITH CONTRAST
TECHNIQUE: Multidetector CT imaging of the abdomen and pelvis was performed
using the standard protocol following bolus administration of
intravenous contrast.
CONTRAST:  80mL OMNIPAQUE IOHEXOL 350 MG/ML SOLN

[Series 3: a/p w/ 5mm · axial · 0.64mm/px · z∈[-448,-73]mm · 13 of 83 slices shown, 15 images]
[im 4/83  soft-tissue]
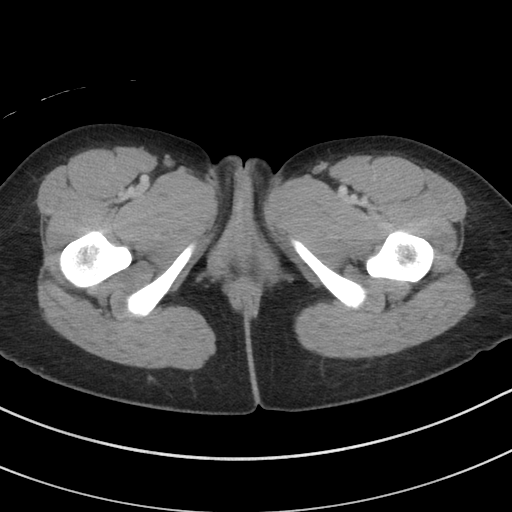
[im 4/83  bone]
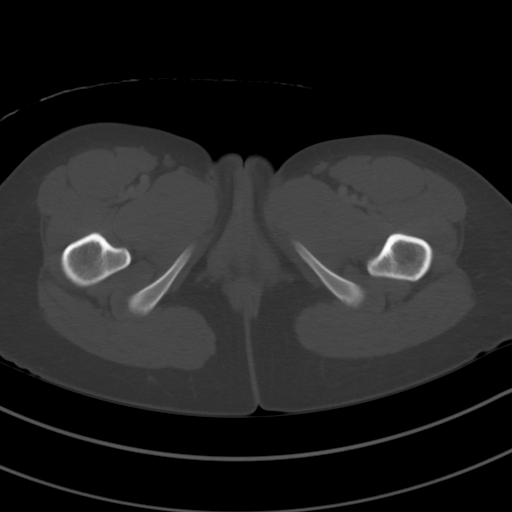
[im 11/83  soft-tissue]
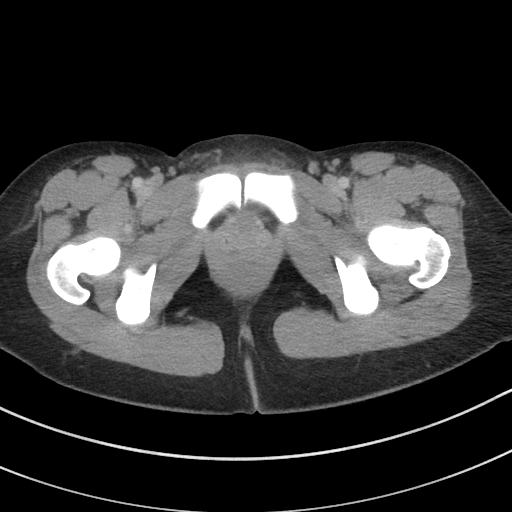
[im 18/83  soft-tissue]
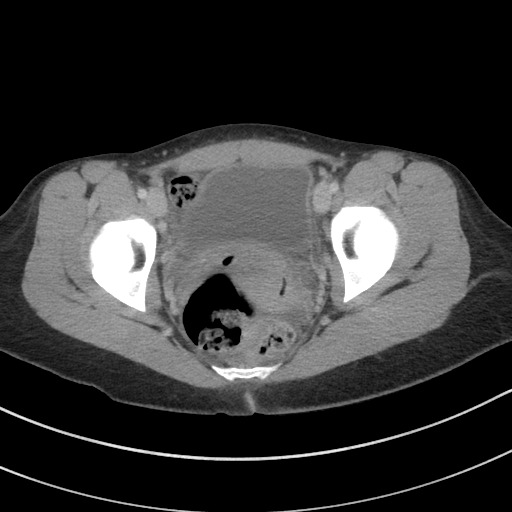
[im 24/83  soft-tissue]
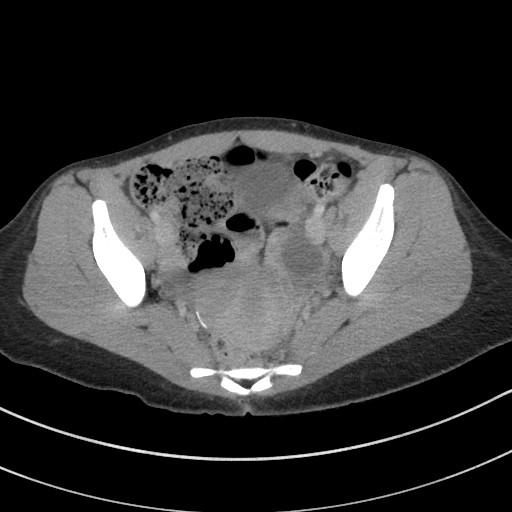
[im 28/83  soft-tissue]
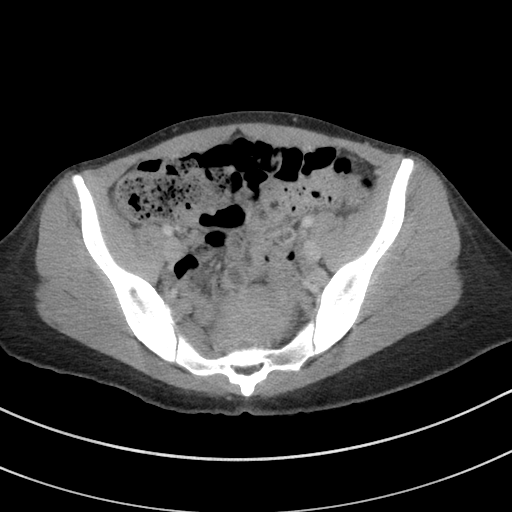
[im 35/83  soft-tissue]
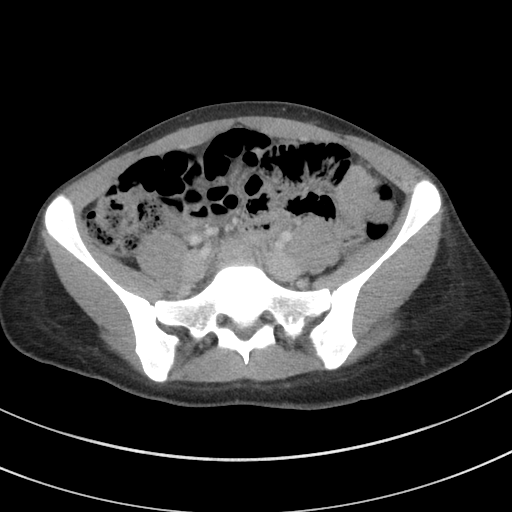
[im 42/83  soft-tissue]
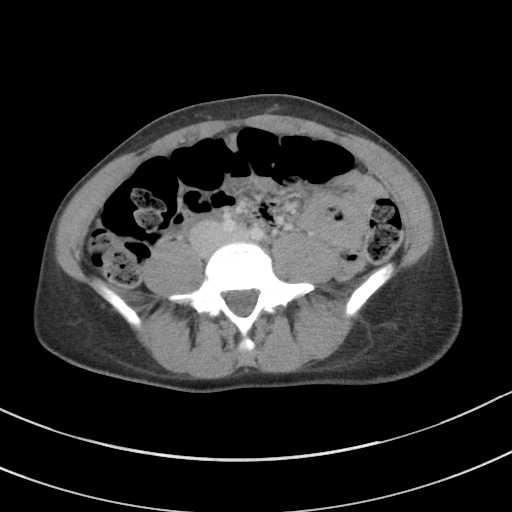
[im 48/83  soft-tissue]
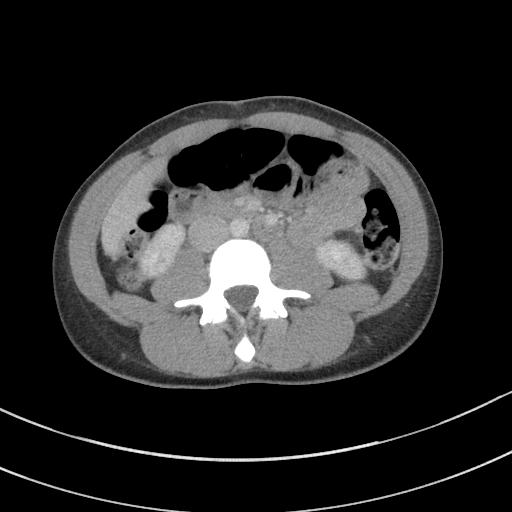
[im 55/83  soft-tissue]
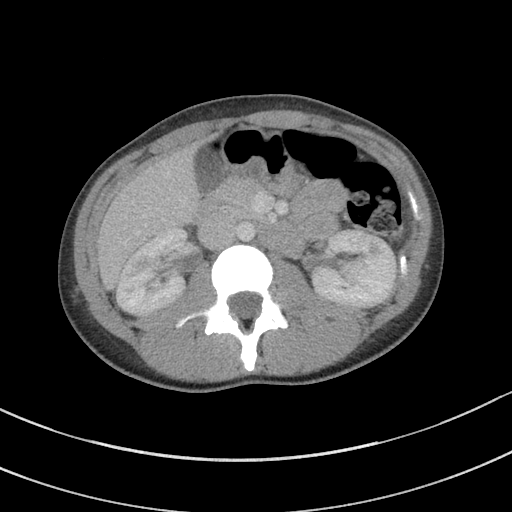
[im 55/83  bone]
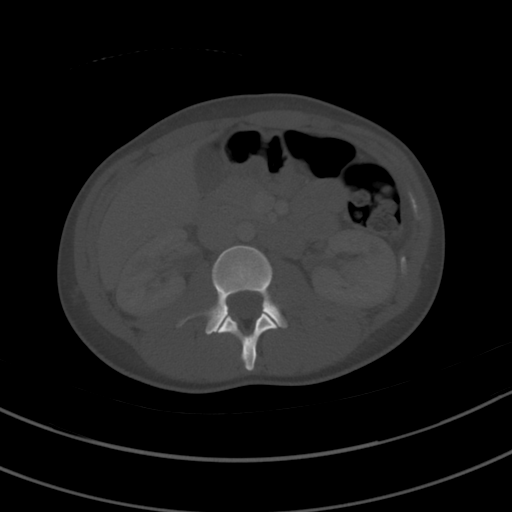
[im 59/83  soft-tissue]
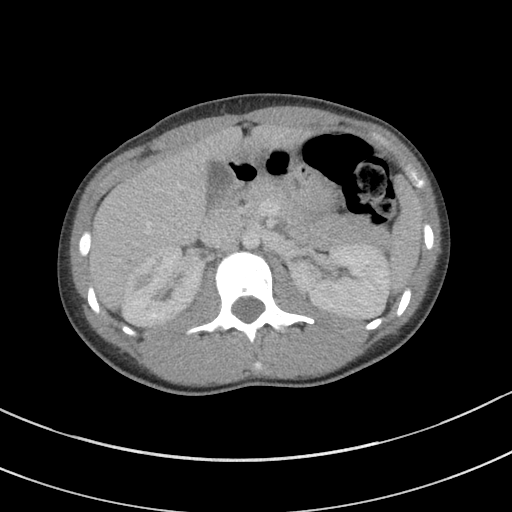
[im 65/83  soft-tissue]
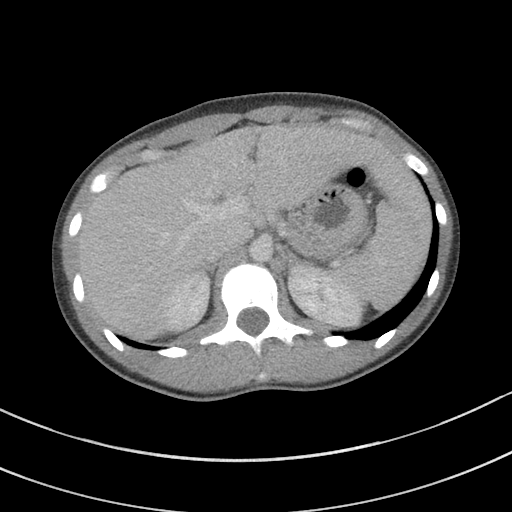
[im 72/83  soft-tissue]
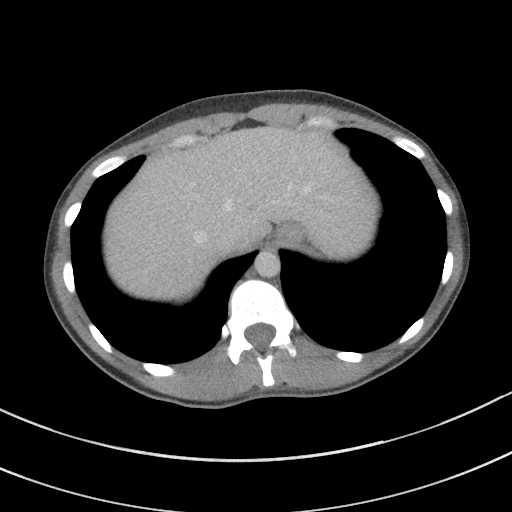
[im 79/83  soft-tissue]
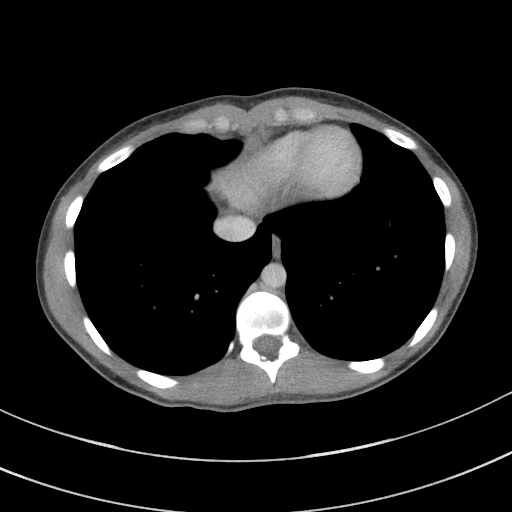

[Series 6: a/p w/ cor · coronal · 0.65mm/px · 3 of 121 slices shown]
[im 41/121  soft-tissue]
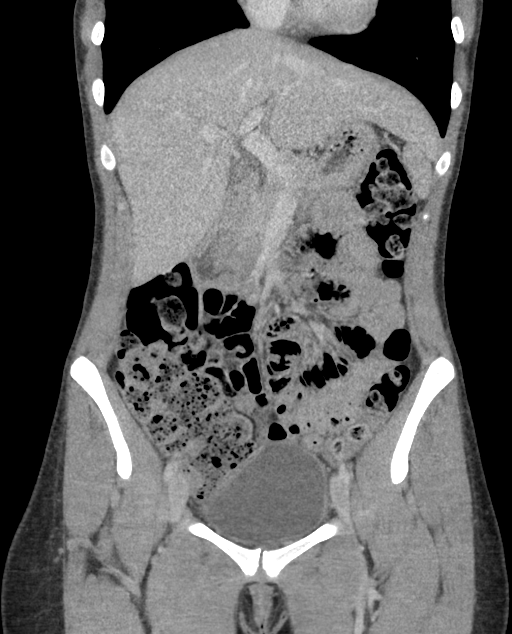
[im 54/121  soft-tissue]
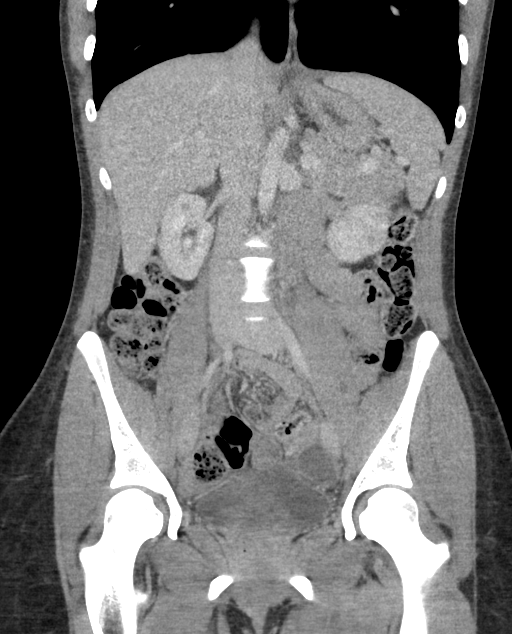
[im 67/121  soft-tissue]
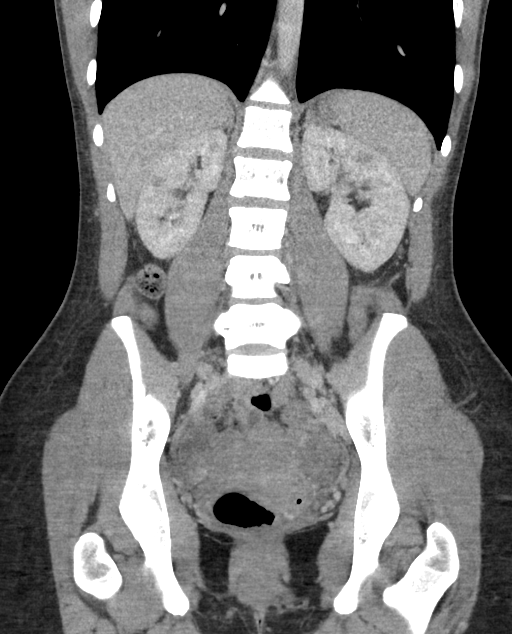

[16 of 46 positions shown; findings below may reference images not displayed]

FINDINGS: Lower chest: No acute abnormality.

Hepatobiliary: No focal liver abnormality is seen. No gallstones,
gallbladder wall thickening, or biliary dilatation.

Pancreas: Unremarkable. No pancreatic ductal dilatation or
surrounding inflammatory changes.

Spleen: Normal in size without focal abnormality.

Adrenals/Urinary Tract: Adrenal glands are unremarkable. Multiple
bilateral areas of renal cortical hypoenhancement are noted. The
areas of hypoenhancement do not extend entirely to the periphery and
are not wedge-shaped. No renal calculi or hydronephrosis on either
side. Bladder is unremarkable.

Stomach/Bowel: Stomach is within normal limits. No pericecal
inflammatory changes are noted to suggest acute appendicitis. No
evidence of bowel wall thickening, distention, or inflammatory
changes.

Vascular/Lymphatic: No significant vascular findings are present. No
enlarged abdominal or pelvic lymph nodes.

Reproductive: A 3.5 cm cyst in the left adnexa is likely
physiologic. The uterus appears normal.

Other: No abdominal wall hernia or abnormality. No abdominopelvic
ascites.

Musculoskeletal: No acute or significant osseous findings.
IMPRESSION: Multiple areas of renal cortical hypoenhancement in both kidneys.
This is nonspecific and differential considerations include acute
pyelonephritis, acute kidney injury, hypotension, and renal
infarcts.

## 2023-08-11 ENCOUNTER — Encounter (HOSPITAL_COMMUNITY): Payer: Self-pay

## 2023-08-11 ENCOUNTER — Ambulatory Visit (HOSPITAL_COMMUNITY)
Admission: RE | Admit: 2023-08-11 | Discharge: 2023-08-11 | Disposition: A | Discharge status: Home / Self Care | Payer: Self-pay | Source: Ambulatory Visit | Attending: Internal Medicine | Admitting: Internal Medicine

## 2023-08-11 VITALS — BP 127/78 | HR 122 | Temp 101.8°F | Resp 16 | Ht 64.5 in | Wt 124.0 lb

## 2023-08-11 DIAGNOSIS — J101 Influenza due to other identified influenza virus with other respiratory manifestations: Secondary | ICD-10-CM

## 2023-08-11 LAB — POC COVID19/FLU A&B COMBO
Covid Antigen, POC: NEGATIVE
Influenza A Antigen, POC: POSITIVE — AB
Influenza B Antigen, POC: NEGATIVE

## 2023-08-11 MED ORDER — ACETAMINOPHEN 325 MG PO TABS
650.0000 mg | ORAL_TABLET | Freq: Once | ORAL | Status: AC
  Administered 2023-08-11: 650 mg via ORAL

## 2023-08-11 MED ORDER — OSELTAMIVIR PHOSPHATE 75 MG PO CAPS: 75.0000 mg | ORAL_CAPSULE | Freq: Two times a day (BID) | ORAL | 0 refills | Status: DC

## 2023-08-11 MED ORDER — PROMETHAZINE-DM 6.25-15 MG/5ML PO SYRP: 5.0000 mL | ORAL_SOLUTION | Freq: Three times a day (TID) | ORAL | 0 refills | Status: DC | PRN

## 2023-08-11 MED ORDER — ACETAMINOPHEN 325 MG PO TABS
ORAL_TABLET | ORAL | Status: AC
Start: 2023-08-11 — End: ?
  Filled 2023-08-11: qty 2

## 2023-08-11 MED ORDER — PREDNISONE 20 MG PO TABS: 40.0000 mg | ORAL_TABLET | Freq: Every day | ORAL | 0 refills | Status: AC

## 2023-08-11 NOTE — ED Triage Notes (Signed)
 Patient here today with c/o cough, chills, sweats, chest congestion, nasal drainage, wheeze, and SOB X 1 day. She has been taking Dayquil and Nyquil with no relief. 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

## 2023-08-11 NOTE — Discharge Instructions (Addendum)
 Flu A is positive. This is a viral infection.  This does not require antibiotic treatment.  We focus treatment on improving the symptoms.  We will treat with the following:  Tamiflu 75 mg twice daily for 5 days. This medication helps you to fight off the flu faster. Stop this if you get worsening headache, nausea, vomiting or hallucinations Prednisone 40 mg (2 tablets) once daily for 5 days. Take this in the morning.  This is a steroid to help with inflammation and pain. Promethazine DM 5 mL every 8 hours as needed for cough.  Use caution as this medication can cause drowsiness. Do not return to work until 24 hours without a fever without fever reducing medication.  Rest and stay hydrated. Drink plenty of fluids even if you don't feel like eating solid foods.  Return to urgent care or PCP if symptoms worsen or fail to resolve.

## 2023-08-11 NOTE — ED Provider Notes (Signed)
 [facility]    CSN: [id number] Arrival date & time: 08/11/23  1627      History   Chief Complaint Chief Complaint  Patient presents with   Chills    i think i have theflu - Entered by patient    HPI Jasmin [name] is a 23 y.o. female.   23 year old female who presents urgent care with complaints of fevers, chills, body aches, cough, shortness of breath, congestion and wheezing.  Her symptoms started yesterday.  She missed work yesterday due to the symptoms.  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  She has been taking DayQuil and NyQuil.  She is eating and drinking but not as much as usual.  She is trying to stay hydrated.     Past Medical History:  Diagnosis Date   ACL tear 03/2015   right   Complete tear of right ACL 04/09/2015    Patient Active Problem List   Diagnosis Date Noted   Complete tear of right ACL 04/09/2015    Past Surgical History:  Procedure Laterality Date   KNEE ARTHROSCOPY WITH ANTERIOR CRUCIATE LIGAMENT (ACL) REPAIR WITH HAMSTRING GRAFT Right 04/12/2015   Procedure: RIGHT KNEE ARTHROSCOPY WITH ANTERIOR CRUCIATE LIGAMENT (ACL) REPAIR WITH HAMSTRING AUTOGRAFT;  Surgeon: Salvatore Marvel, MD;  Location: [facility];  Service: Orthopedics;  Laterality: Right;  ANESTHESIA:  GENERAL, PRE/POST OP FEMORAL NERVE    OB History   No obstetric history on file.      Home Medications    Prior to Admission medications   Not on File    Family History History reviewed. No pertinent family history.  Social History Social History   Tobacco Use   Smoking status: Never   Smokeless tobacco: Never  Vaping Use   Vaping status: Never Used  Substance Use Topics   Alcohol use: No   Drug use: No     Allergies   Patient has no known allergies.   Review of Systems Review of Systems  Constitutional:  Positive for chills and fever.  HENT:  Positive for congestion. Negative for ear pain and sore  throat.   Eyes:  Negative for pain and visual disturbance.  Respiratory:  Positive for cough and shortness of breath.   Cardiovascular:  Negative for chest pain and palpitations.  Gastrointestinal:  Negative for abdominal pain and vomiting.  Genitourinary:  Negative for dysuria and hematuria.  Musculoskeletal:  Negative for arthralgias and back pain.  Skin:  Negative for color change and rash.  Neurological:  Negative for seizures and syncope.  All other systems reviewed and are negative.    Physical Exam Triage Vital Signs ED Triage Vitals  Encounter Vitals Group     BP 08/11/23 1644 127/78     Systolic BP Percentile --      Diastolic BP Percentile --      Pulse Rate 08/11/23 1644 (!) 122     Resp 08/11/23 1644 16     Temp 08/11/23 1644 (!) 101.8 F (38.8 C)     Temp Source 08/11/23 1644 Oral     SpO2 08/11/23 1644 99 %     Weight 08/11/23 1645 124 lb (56.2 kg)     Height 08/11/23 1645 5' 4.5" (1.638 m)     Head Circumference --      Peak Flow --      Pain Score 08/11/23 1645 0     Pain Loc --      Pain  Education --      Exclude from [employer] Chart --    No data found.  Updated Vital Signs BP 127/78 (BP Location: Right Arm)   Pulse (!) 122   Temp (!) 101.8 F (38.8 C) (Oral)   Resp 16   Ht 5' 4.5" (1.638 m)   Wt 124 lb (56.2 kg)   LMP 08/04/2023 (Approximate)   SpO2 99%   BMI 20.96 kg/m   Visual Acuity Right Eye Distance:   Left Eye Distance:   Bilateral Distance:    Right Eye Near:   Left Eye Near:    Bilateral Near:     Physical Exam Vitals and nursing note reviewed.  Constitutional:      General: She is not in acute distress.    Appearance: She is well-developed.  HENT:     Head: Normocephalic and atraumatic.  Eyes:     Conjunctiva/sclera: Conjunctivae normal.  Cardiovascular:     Rate and Rhythm: Normal rate and regular rhythm.     Heart sounds: No murmur heard. Pulmonary:     Effort: Pulmonary effort is normal. No respiratory distress.      Breath sounds: Normal breath sounds.  Abdominal:     Palpations: Abdomen is soft.     Tenderness: There is no abdominal tenderness.  Musculoskeletal:        General: No swelling.     Cervical back: Neck supple.  Skin:    General: Skin is warm and dry.     Capillary Refill: Capillary refill takes less than 2 seconds.  Neurological:     Mental Status: She is alert.  Psychiatric:        Mood and Affect: Mood normal.      UC Treatments / Results  Labs (all labs ordered are listed, but only abnormal results are displayed) Labs Reviewed  POC COVID19/FLU A&B COMBO    EKG   Radiology No results found.  Procedures Procedures (including critical care time)  Medications Ordered in UC Medications  acetaminophen (TYLENOL) tablet 650 mg (650 mg Oral Given 08/11/23 1707)    Initial Impression / Assessment and Plan / UC Course  I have reviewed the triage vital signs and the nursing notes.  Pertinent labs & imaging results that were available during my care of the patient were reviewed by me and considered in my medical decision making (see chart for details).     Influenza A   Flu A is positive. This is a viral infection.  This does not require antibiotic treatment.  We focus treatment on improving the symptoms.  We will treat with the following:  Tamiflu 75 mg twice daily for 5 days. This medication helps you to fight off the flu faster. Stop this if you get worsening headache, nausea, vomiting or hallucinations Prednisone 40 mg (2 tablets) once daily for 5 days. Take this in the morning.  This is a steroid to help with inflammation and pain. Promethazine DM 5 mL every 8 hours as needed for cough.  Use caution as this medication can cause drowsiness. Do not return to work until 24 hours without a fever without fever reducing medication.  Rest and stay hydrated. Drink plenty of fluids even if you don't feel like eating solid foods.  Return to urgent care or PCP if symptoms worsen  or fail to resolve.    Final Clinical Impressions(s) / UC Diagnoses   Final diagnoses:  None   Discharge Instructions   None    ED Prescriptions  None    PDMP not reviewed this encounter.   Landis Martins, [place] 08/11/23 1725

## 2023-11-22 ENCOUNTER — Encounter (HOSPITAL_COMMUNITY): Payer: Self-pay | Admitting: Emergency Medicine

## 2023-11-22 ENCOUNTER — Ambulatory Visit (HOSPITAL_COMMUNITY)
Admission: EM | Admit: 2023-11-22 | Discharge: 2023-11-22 | Disposition: A | Discharge status: Home / Self Care | Attending: Emergency Medicine | Admitting: Emergency Medicine

## 2023-11-22 DIAGNOSIS — B86 Scabies: Secondary | ICD-10-CM

## 2023-11-22 MED ORDER — PERMETHRIN 5 % EX CREA: TOPICAL_CREAM | CUTANEOUS | 0 refills | Status: DC

## 2023-11-22 NOTE — Discharge Instructions (Addendum)
 Your rash is consistent with scabies.  You can use 25 mg of Benadryl every 6-8 hours to help with itching.  Topical hydrocortisone and Lubriderm or moisturizing lotion may help as well.  Massage permethrin cream thoroughly into the skin from the neck to the soles of the feet, including areas under the fingernails and toenails. Permethrin should be removed by washing (shower or bath) after 8 to 14 hours. Treatment is often performed overnight. A second application one to two weeks later may be necessary to eliminate mites.  This treatment is available over-the-counter, I suggest treating her boyfriend as well.  Return to clinic for new or urgent symptoms.

## 2023-11-22 NOTE — ED Triage Notes (Signed)
 Pt reports her boyfriend recently had rash and when putting cream on his back she didn't wear gloves.  Reports while ago hands felt really dry and broke out in rash. Used the cortisone cream he was prescribed. Reports feels tight and itches.

## 2023-11-22 NOTE — ED Provider Notes (Signed)
 [facility]    CSN: [id number] Arrival date & time: 11/22/23  1607      History   Chief Complaint Chief Complaint  Patient presents with   Rash    HPI Jasmin [name] is a 23 y.o. female.   Patient presents to clinic for concern of an itchy rash to the interdigital space of both hands.  Her boyfriend has a similar rash and she was applying a topical hydrocortisone cream to him and has been doing this.  The most recent time she applied it she forgot to wear gloves.  Her hands have felt really dry and itchy.  Has been using topical cortisone cream with moisturizer without any improvement.  The history is provided by the patient and medical records.  Rash   Past Medical History:  Diagnosis Date   ACL tear 03/2015   right   Complete tear of right ACL 04/09/2015    Patient Active Problem List   Diagnosis Date Noted   Complete tear of right ACL 04/09/2015    Past Surgical History:  Procedure Laterality Date   KNEE ARTHROSCOPY WITH ANTERIOR CRUCIATE LIGAMENT (ACL) REPAIR WITH HAMSTRING GRAFT Right 04/12/2015   Procedure: RIGHT KNEE ARTHROSCOPY WITH ANTERIOR CRUCIATE LIGAMENT (ACL) REPAIR WITH HAMSTRING AUTOGRAFT;  Surgeon: Elly Habermann, MD;  Location: [facility];  Service: Orthopedics;  Laterality: Right;  ANESTHESIA:  GENERAL, PRE/POST OP FEMORAL NERVE    OB History   No obstetric history on file.      Home Medications    Prior to Admission medications   Medication Sig Start Date End Date Taking? Authorizing Provider  permethrin (ELIMITE) 5 % cream Apply to affected area once 11/22/23  Yes Harlow Lighter, Kara Melching  N, FNP    Family History No family history on file.  Social History Social History   Tobacco Use   Smoking status: Never   Smokeless tobacco: Never  Vaping Use   Vaping status: Never Used  Substance Use Topics   Alcohol use: No   Drug use: No     Allergies   Patient has no known allergies.   Review of Systems Review of  Systems  Per HPI  Physical Exam Triage Vital Signs ED Triage Vitals  Encounter Vitals Group     BP 11/22/23 1700 108/76     Systolic BP Percentile --      Diastolic BP Percentile --      Pulse Rate 11/22/23 1700 85     Resp 11/22/23 1700 16     Temp 11/22/23 1700 98.9 F (37.2 C)     Temp Source 11/22/23 1700 Oral     SpO2 11/22/23 1700 100 %     Weight --      Height --      Head Circumference --      Peak Flow --      Pain Score 11/22/23 1659 0     Pain Loc --      Pain Education --      Exclude from Growth Chart --    No data found.  Updated Vital Signs BP 108/76 (BP Location: Left Arm)   Pulse 85   Temp 98.9 F (37.2 C) (Oral)   Resp 16   LMP 11/21/2023   SpO2 100%   Visual Acuity Right Eye Distance:   Left Eye Distance:   Bilateral Distance:    Right Eye Near:   Left Eye Near:    Bilateral Near:     Physical Exam  Vitals and nursing note reviewed.  Constitutional:      Appearance: Normal appearance.  HENT:     Head: Normocephalic and atraumatic.     Right Ear: External ear normal.     Left Ear: External ear normal.     Nose: Nose normal.     Mouth/Throat:     Mouth: Mucous membranes are moist.  Eyes:     Conjunctiva/sclera: Conjunctivae normal.  Cardiovascular:     Rate and Rhythm: Normal rate.  Pulmonary:     Effort: Pulmonary effort is normal. No respiratory distress.  Skin:    General: Skin is warm and dry.     Findings: Rash present.     Comments: Erythematous, excoriated papules present at the interdigital space of both hands, scant papules to wrist.  Neurological:     General: No focal deficit present.     Mental Status: She is alert.  Psychiatric:        Mood and Affect: Mood normal.      UC Treatments / Results  Labs (all labs ordered are listed, but only abnormal results are displayed) Labs Reviewed - No data to display  EKG   Radiology No results found.  Procedures Procedures (including critical care  time)  Medications Ordered in UC Medications - No data to display  Initial Impression / Assessment and Plan / UC Course  I have reviewed the triage vital signs and the nursing notes.  Pertinent labs & imaging results that were available during my care of the patient were reviewed by me and considered in my medical decision making (see chart for details).  Vitals in triage reviewed, patient is hemodynamically stable.  Erythematous, excoriated papules are present to the interdigital space in the wrist of both hands, consistent with scabies.  Will treat with permethrin cream.  Advised significant other receives treatment as well, as he is most likely the cause of transmission.  Symptomatic management for itching discussed.  Plan of care, follow-up care return precautions given, no questions at this time.     Final Clinical Impressions(s) / UC Diagnoses   Final diagnoses:  Scabies     Discharge Instructions      Your rash is consistent with scabies.  You can use 25 mg of Benadryl every 6-8 hours to help with itching.  Topical hydrocortisone and Lubriderm or moisturizing lotion may help as well.  Massage permethrin cream thoroughly into the skin from the neck to the soles of the feet, including areas under the fingernails and toenails. Permethrin should be removed by washing (shower or bath) after 8 to 14 hours. Treatment is often performed overnight. A second application one to two weeks later may be necessary to eliminate mites.  This treatment is available over-the-counter, I suggest treating her boyfriend as well.  Return to clinic for new or urgent symptoms.   ED Prescriptions     Medication Sig Dispense Auth. Provider   permethrin (ELIMITE) 5 % cream Apply to affected area once 60 g Harlow Lighter, Tekelia Kareem  N, FNP      PDMP not reviewed this encounter.   Harlow Lighter, Vannessa [place]  N, FNP 11/22/23 1725

## 2024-03-25 ENCOUNTER — Ambulatory Visit (HOSPITAL_COMMUNITY)
Admission: EM | Admit: 2024-03-25 | Discharge: 2024-03-25 | Disposition: A | Discharge status: Home / Self Care | Attending: Nurse Practitioner | Admitting: Nurse Practitioner

## 2024-03-25 ENCOUNTER — Encounter (HOSPITAL_COMMUNITY): Payer: Self-pay

## 2024-03-25 DIAGNOSIS — A084 Viral intestinal infection, unspecified: Secondary | ICD-10-CM

## 2024-03-25 DIAGNOSIS — R112 Nausea with vomiting, unspecified: Secondary | ICD-10-CM

## 2024-03-25 DIAGNOSIS — E860 Dehydration: Secondary | ICD-10-CM

## 2024-03-25 LAB — POCT URINALYSIS DIP (MANUAL ENTRY)
Glucose, UA: NEGATIVE mg/dL
Leukocytes, UA: NEGATIVE
Nitrite, UA: NEGATIVE
Protein Ur, POC: 30 mg/dL — AB
Spec Grav, UA: >=1.03 — AB (ref 1.010–1.025)
Urobilinogen, UA: 1 U/dL
pH, UA: 6 (ref 5.0–8.0)

## 2024-03-25 LAB — POCT URINE PREGNANCY: Preg Test, Ur: NEGATIVE

## 2024-03-25 MED ORDER — ONDANSETRON 4 MG PO TBDP
ORAL_TABLET | ORAL | Status: AC
  Filled 2024-03-25: qty 1

## 2024-03-25 MED ORDER — SODIUM CHLORIDE 0.9 % IV BOLUS
1000.0000 mL | Freq: Once | INTRAVENOUS | Status: AC
  Administered 2024-03-25: 1000 mL via INTRAVENOUS

## 2024-03-25 MED ORDER — ONDANSETRON 8 MG PO TBDP: 8.0000 mg | ORAL_TABLET | Freq: Three times a day (TID) | ORAL | 0 refills | Status: DC | PRN

## 2024-03-25 MED ORDER — ONDANSETRON 4 MG PO TBDP
4.0000 mg | ORAL_TABLET | Freq: Once | ORAL | Status: AC
  Administered 2024-03-25: 4 mg via ORAL

## 2024-03-25 NOTE — ED Triage Notes (Signed)
 Patient states she woke up last night with vomiting and abdominal pain.  Medication for relief- none.

## 2024-03-25 NOTE — Discharge Instructions (Addendum)
 You were seen today for nausea, vomiting, and stomach pain that began yesterday afternoon. Your evaluation, including physical exam and urine tests, did not show any dangerous problems. The urine test showed signs of mild dehydration, which likely explains why you have been feeling weak and dizzy. You were given intravenous fluids and medicine for nausea, and you improved before going home. For the next several days, it is important to stay well hydrated. Drink plenty of clear fluids such as water, electrolyte drinks (like Gatorade or Pedialyte), or clear broths. Take small sips frequently rather than trying to drink a large amount at once. As your stomach settles, you may slowly introduce bland foods such as plain crackers, rice, toast, applesauce, bananas, or broth-based soups. Avoid dairy, fried foods, spicy foods, caffeine, and greasy meals until you are feeling completely better. If nausea returns, lying still, keeping the room cool, and sipping cold fluids can sometimes help. Mild stomach discomfort and fatigue are common and should improve within a few days. However, if you notice that your symptoms are not improving, or if you have ongoing trouble keeping fluids down, you should contact your primary care provider for follow-up. You should also check in with your doctor if you continue to have decreased urine output, feel persistently weak, or have any questions about your recovery. Go to the emergency department right away if you develop severe or worsening abdominal pain, continuous vomiting that prevents you from keeping fluids down, fever or chills, blood in your vomit, fainting, spreading abdominal swelling, or if you feel like you are getting worse instead of better. At this time, no routine follow-up is required, but you should listen to your body and reach out to your healthcare provider if you are concerned about how you are healing.

## 2024-03-25 NOTE — ED Provider Notes (Signed)
 [facility]    CSN: [id number] Arrival date & time: 03/25/24  9185      History   Chief Complaint Chief Complaint  Patient presents with   Emesis    HPI Jasmin [name] is a 23 y.o. female.   Discussed the use of AI scribe software for clinical note transcription with the patient, who gave verbal consent to proceed.   The patient presents with nausea, vomiting, and abdominal pain that began yesterday afternoon. She reports eating eggs and sausage around 11:00-11:30 AM on Wednesday, which she prepared herself. She believes she may have eaten out on Tuesday but cannot recall the specific meal. Vomiting began later in the afternoon after attempting to eat two [place] fries. She attempted to sleep, but continued to have multiple episodes of vomiting overnight. Emesis was described as clear, as she had not eaten and had only consumed water.  She denies diarrhea but feels dehydrated despite increased water intake, endorsing dry mouth and inability to tolerate solid foods, though she is still able to drink fluids. Associated symptoms include chills at work yesterday with alternating sensations of hot and cold, a headache yesterday that has since resolved, and dizziness while sitting in the waiting area which improved upon ambulation. She denies cough, upper respiratory symptoms, or hematemesis.  The abdominal pain is intermittent, described as sharp with a sense of pressure, and localized to the abdominal area. She denies dysuria, frequency, or urgency, but reports reduced urine output.  Her last menstrual period began 10 days ago. She is sexually active, not on birth control, and reports intermittent condom use.  The following sections of the patient's history were reviewed and updated as appropriate: allergies, current medications, past family history, past medical history, past social history, past surgical history, and problem list.     Past Medical History:  Diagnosis Date    ACL tear 03/2015   right   Complete tear of right ACL 04/09/2015    Patient Active Problem List   Diagnosis Date Noted   Complete tear of right ACL 04/09/2015    Past Surgical History:  Procedure Laterality Date   KNEE ARTHROSCOPY WITH ANTERIOR CRUCIATE LIGAMENT (ACL) REPAIR WITH HAMSTRING GRAFT Right 04/12/2015   Procedure: RIGHT KNEE ARTHROSCOPY WITH ANTERIOR CRUCIATE LIGAMENT (ACL) REPAIR WITH HAMSTRING AUTOGRAFT;  Surgeon: Lamar Millman, MD;  Location: [facility];  Service: Orthopedics;  Laterality: Right;  ANESTHESIA:  GENERAL, PRE/POST OP FEMORAL NERVE    OB History   No obstetric history on file.      Home Medications    Prior to Admission medications   Medication Sig Start Date End Date Taking? Authorizing Provider  ondansetron  (ZOFRAN -ODT) 8 MG disintegrating tablet Take 1 tablet (8 mg total) by mouth every 8 (eight) hours as needed for nausea or vomiting. 03/25/24  Yes Iola Lukes, FNP    Family History History reviewed. No pertinent family history.  Social History Social History   Tobacco Use   Smoking status: Never   Smokeless tobacco: Never  Vaping Use   Vaping status: Never Used  Substance Use Topics   Alcohol use: No   Drug use: No     Allergies   Patient has no known allergies.   Review of Systems Review of Systems  Constitutional:  Positive for appetite change (decreased ability to eat but drinking lots of water) and chills. Negative for fever.  HENT: Negative.    Respiratory: Negative.    Gastrointestinal:  Positive for abdominal pain, nausea and  vomiting. Negative for diarrhea.  Genitourinary:  Negative for dysuria and menstrual problem (LMP was about 10 days ago).  Musculoskeletal:  Negative for myalgias.  Neurological:  Positive for dizziness (earlier this morning but none currently) and headaches (yesterday but none today).  All other systems reviewed and are negative.    Physical Exam Triage Vital Signs ED  Triage Vitals [03/25/24 0900]  Encounter Vitals Group     BP 106/61     Girls Systolic BP Percentile      Girls Diastolic BP Percentile      Boys Systolic BP Percentile      Boys Diastolic BP Percentile      Pulse Rate (!) 105     Resp 18     Temp (!) 97.4 F (36.3 C)     Temp Source Oral     SpO2 100 %     Weight      Height      Head Circumference      Peak Flow      Pain Score      Pain Loc      Pain Education      Exclude from Growth Chart    No data found.  Updated Vital Signs BP 106/61 (BP Location: Left Arm)   Pulse (!) 105   Temp (!) 97.4 F (36.3 C) (Oral)   Resp 18   LMP 03/15/2024 (Approximate)   SpO2 100%   Visual Acuity Right Eye Distance:   Left Eye Distance:   Bilateral Distance:    Right Eye Near:   Left Eye Near:    Bilateral Near:     Physical Exam Vitals reviewed.  Constitutional:      General: She is awake. She is not in acute distress.    Appearance: Normal appearance. She is well-developed. She is not ill-appearing, toxic-appearing or diaphoretic.  HENT:     Head: Normocephalic.     Right Ear: Hearing normal.     Left Ear: Hearing normal.     Nose: Nose normal.     Mouth/Throat:     Mouth: Mucous membranes are moist.  Eyes:     General: Vision grossly intact.     Conjunctiva/sclera: Conjunctivae normal.  Cardiovascular:     Rate and Rhythm: Normal rate and regular rhythm.     Heart sounds: Normal heart sounds.  Pulmonary:     Effort: Pulmonary effort is normal.     Breath sounds: Normal breath sounds and air entry.  Abdominal:     General: There is no distension.     Palpations: Abdomen is soft.     Tenderness: There is no abdominal tenderness. There is no right CVA tenderness or left CVA tenderness.  Musculoskeletal:        General: Normal range of motion.     Cervical back: Normal range of motion and neck supple.  Skin:    General: Skin is warm and dry.  Neurological:     General: No focal deficit present.     Mental  Status: She is alert and oriented to person, place, and time.  Psychiatric:        Speech: Speech normal.        Behavior: Behavior is cooperative.      UC Treatments / Results  Labs (all labs ordered are listed, but only abnormal results are displayed) Labs Reviewed  POCT URINALYSIS DIP (MANUAL ENTRY) - Abnormal; Notable for the following components:      Result Value  Clarity, UA cloudy (*)    Bilirubin, UA small (*)    Ketones, POC UA large (80) (*)    Spec Grav, UA >=1.030 (*)    Blood, UA small (*)    Protein Ur, POC =30 (*)    All other components within normal limits  POCT URINE PREGNANCY    EKG   Radiology No results found.  Procedures Procedures (including critical care time)  Medications Ordered in UC Medications  ondansetron  (ZOFRAN -ODT) disintegrating tablet 4 mg (4 mg Oral Given 03/25/24 0945)  sodium chloride  0.9 % bolus 1,000 mL (0 mLs Intravenous Stopped 03/25/24 1201)    Initial Impression / Assessment and Plan / UC Course  I have reviewed the triage vital signs and the nursing notes.  Pertinent labs & imaging results that were available during my care of the patient were reviewed by me and considered in my medical decision making (see chart for details).     The patient presented with acute onset of nausea, vomiting, and intermittent abdominal pain that began yesterday afternoon. She reports multiple episodes of non-bloody, non-bilious emesis with associated inability to tolerate solid foods, though able to maintain fluid intake. Associated symptoms included transient chills, headache, and dizziness, which have since resolved. No diarrhea or urinary complaints were reported.  On exam, the patient was alert, afebrile, non-toxic, and in no acute distress. Abdominal exam was unremarkable. Urine pregnancy test was negative. UA demonstrated large ketones consistent with dehydration, but no evidence of UTI. IV fluids (1L normal saline) and ondansetron  4 mg  were administered with improvement in symptoms.  Given the benign exam, stable vital signs, negative pregnancy test, and improvement after fluids and antiemetic, the clinical picture is most consistent with viral gastroenteritis or possible foodborne illness. Differential diagnoses such as acute surgical abdomen (appendicitis, cholecystitis, bowel obstruction) were considered but are less likely at this time given reassuring exam and overall stability.  The patient was discharged home in stable condition. Supportive care instructions reviewed, including aggressive oral hydration and bland diet with avoidance of dairy, greasy, fried, or spicy foods. Strict ED precautions were discussed for worsening abdominal pain, persistent vomiting, inability to tolerate fluids, fever, or any other concerning symptoms. Patient verbalized understanding and agreement with plan.  Today's evaluation has revealed no signs of a dangerous process. Discussed diagnosis with patient and/or guardian. Patient and/or guardian aware of their diagnosis, possible red flag symptoms to watch out for and need for close follow up. Patient and/or guardian understands verbal and written discharge instructions. Patient and/or guardian comfortable with plan and disposition.  Patient and/or guardian has a clear mental status at this time, good insight into illness (after discussion and teaching) and has clear judgment to make decisions regarding their care  Documentation was completed with the aid of voice recognition software. Transcription may contain typographical errors.  Final Clinical Impressions(s) / UC Diagnoses   Final diagnoses:  Nausea and vomiting, unspecified vomiting type  Viral gastroenteritis  Dehydration     Discharge Instructions      You were seen today for nausea, vomiting, and stomach pain that began yesterday afternoon. Your evaluation, including physical exam and urine tests, did not show any dangerous  problems. The urine test showed signs of mild dehydration, which likely explains why you have been feeling weak and dizzy. You were given intravenous fluids and medicine for nausea, and you improved before going home. For the next several days, it is important to stay well hydrated. Drink plenty of clear fluids  such as water, electrolyte drinks (like Gatorade or Pedialyte), or clear broths. Take small sips frequently rather than trying to drink a large amount at once. As your stomach settles, you may slowly introduce bland foods such as plain crackers, rice, toast, applesauce, bananas, or broth-based soups. Avoid dairy, fried foods, spicy foods, caffeine, and greasy meals until you are feeling completely better. If nausea returns, lying still, keeping the room cool, and sipping cold fluids can sometimes help. Mild stomach discomfort and fatigue are common and should improve within a few days. However, if you notice that your symptoms are not improving, or if you have ongoing trouble keeping fluids down, you should contact your primary care provider for follow-up. You should also check in with your doctor if you continue to have decreased urine output, feel persistently weak, or have any questions about your recovery. Go to the emergency department right away if you develop severe or worsening abdominal pain, continuous vomiting that prevents you from keeping fluids down, fever or chills, blood in your vomit, fainting, spreading abdominal swelling, or if you feel like you are getting worse instead of better. At this time, no routine follow-up is required, but you should listen to your body and reach out to your healthcare provider if you are concerned about how you are healing.     ED Prescriptions     Medication Sig Dispense Auth. Provider   ondansetron  (ZOFRAN -ODT) 8 MG disintegrating tablet Take 1 tablet (8 mg total) by mouth every 8 (eight) hours as needed for nausea or vomiting. 12 tablet Iola Lukes, FNP      PDMP not reviewed this encounter.   Iola Lukes, [place] 03/25/24 1213

## 2024-06-23 ENCOUNTER — Ambulatory Visit
Admission: EM | Admit: 2024-06-23 | Discharge: 2024-06-23 | Disposition: A | Discharge status: Home / Self Care | Payer: Self-pay | Attending: Family Medicine | Admitting: Family Medicine

## 2024-06-23 ENCOUNTER — Ambulatory Visit (HOSPITAL_COMMUNITY): Payer: Self-pay | Attending: Family Medicine

## 2024-06-23 DIAGNOSIS — R110 Nausea: Secondary | ICD-10-CM

## 2024-06-23 DIAGNOSIS — R6889 Other general symptoms and signs: Secondary | ICD-10-CM

## 2024-06-23 LAB — POC COVID19/FLU A&B COMBO
Covid Antigen, POC: NEGATIVE
Influenza A Antigen, POC: NEGATIVE
Influenza B Antigen, POC: NEGATIVE

## 2024-06-23 MED ORDER — ONDANSETRON 8 MG PO TBDP: 8.0000 mg | ORAL_TABLET | Freq: Three times a day (TID) | ORAL | 0 refills | Status: AC | PRN

## 2024-06-23 NOTE — ED Provider Notes (Signed)
 " [facility]    CSN: [id number] Arrival date & time: 06/23/24  1542      History   Chief Complaint Chief Complaint  Patient presents with   Emesis   Chills    HPI Jasmin [name] is a 24 y.o. female.   HPI  Past Medical History:  Diagnosis Date   ACL tear 03/2015   right   Complete tear of right ACL 04/09/2015    Patient Active Problem List   Diagnosis Date Noted   Complete tear of right ACL 04/09/2015    Past Surgical History:  Procedure Laterality Date   KNEE ARTHROSCOPY WITH ANTERIOR CRUCIATE LIGAMENT (ACL) REPAIR WITH HAMSTRING GRAFT Right 04/12/2015   Procedure: RIGHT KNEE ARTHROSCOPY WITH ANTERIOR CRUCIATE LIGAMENT (ACL) REPAIR WITH HAMSTRING AUTOGRAFT;  Surgeon: Lamar Millman, MD;  Location: [facility];  Service: Orthopedics;  Laterality: Right;  ANESTHESIA:  GENERAL, PRE/POST OP FEMORAL NERVE    OB History   No obstetric history on file.      Home Medications    Prior to Admission medications  Medication Sig Start Date End Date Taking? Authorizing Provider  ondansetron  (ZOFRAN -ODT) 8 MG disintegrating tablet Take 1 tablet (8 mg total) by mouth every 8 (eight) hours as needed for nausea or vomiting. 06/23/24  Yes Teddy Sharper, FNP    Family History History reviewed. No pertinent family history.  Social History Social History[1]   Allergies   Patient has no known allergies.   Review of Systems Review of Systems  Constitutional:  Positive for diaphoresis.  Gastrointestinal:  Positive for vomiting.  Musculoskeletal:  Positive for arthralgias and myalgias.  All other systems reviewed and are negative.    Physical Exam Triage Vital Signs ED Triage Vitals  Encounter Vitals Group     BP      Girls Systolic BP Percentile      Girls Diastolic BP Percentile      Boys Systolic BP Percentile      Boys Diastolic BP Percentile      Pulse      Resp      Temp      Temp src      SpO2      Weight      Height       Head Circumference      Peak Flow      Pain Score      Pain Loc      Pain Education      Exclude from Growth Chart    No data found.  Updated Vital Signs BP 106/74 (BP Location: Right Arm)   Pulse 97   Temp 98.1 F (36.7 C) (Oral)   Resp 17   SpO2 99%    Physical Exam Vitals and nursing note reviewed.  Constitutional:      Appearance: Normal appearance. She is normal weight.  HENT:     Head: Normocephalic and atraumatic.     Right Ear: Tympanic membrane, ear canal and external ear normal.     Left Ear: Tympanic membrane, ear canal and external ear normal.     Mouth/Throat:     Mouth: Mucous membranes are moist.     Pharynx: Oropharynx is clear.  Eyes:     Extraocular Movements: Extraocular movements intact.     Conjunctiva/sclera: Conjunctivae normal.     Pupils: Pupils are equal, round, and reactive to light.  Cardiovascular:     Rate and Rhythm: Normal rate and regular rhythm.  Heart sounds: Normal heart sounds.  Pulmonary:     Effort: Pulmonary effort is normal.     Breath sounds: Normal breath sounds. No wheezing, rhonchi or rales.  Musculoskeletal:        General: Normal range of motion.  Skin:    General: Skin is warm and dry.  Neurological:     General: No focal deficit present.     Mental Status: She is alert and oriented to person, place, and time.  Psychiatric:        Mood and Affect: Mood normal.        Behavior: Behavior normal.      UC Treatments / Results  Labs (all labs ordered are listed, but only abnormal results are displayed) Labs Reviewed  POC COVID19/FLU A&B COMBO    EKG   Radiology No results found.  Procedures Procedures (including critical care time)  Medications Ordered in UC Medications - No data to display  Initial Impression / Assessment and Plan / UC Course  I have reviewed the triage vital signs and the nursing notes.  Pertinent labs & imaging results that were available during my care of the patient were  reviewed by me and considered in my medical decision making (see chart for details).     MDM: 1.  Nausea-Rx'd Zofran  8 mg disintegrating tablet: Take 1 tablet every 8 hours, as needed for nausea; 2.  Flulike symptoms-COVID-19 and influenza both negative this evening.  Work note provided to patient per request prior to discharge. Advised patient may use Zofran  daily or as needed for nausea.  Encouraged increase daily water intake to 64 ounces per day while taking this medication.  Advised if symptoms worsen and/or unresolved please follow-up with your PCP or here for further evaluation.  Patient discharged home, hemodynamically stable. Final Clinical Impressions(s) / UC Diagnoses   Final diagnoses:  Flu-like symptoms  Nausea     Discharge Instructions      Advised patient may use Zofran  daily or as needed for nausea.  Encouraged increase daily water intake to 64 ounces per day while taking this medication.  Advised if symptoms worsen and/or unresolved please follow-up with your PCP or here for further evaluation.     ED Prescriptions     Medication Sig Dispense Auth. Provider   ondansetron  (ZOFRAN -ODT) 8 MG disintegrating tablet Take 1 tablet (8 mg total) by mouth every 8 (eight) hours as needed for nausea or vomiting. 24 tablet Lelaina Oatis, FNP      PDMP not reviewed this encounter.    [1]  Social History Tobacco Use   Smoking status: Never   Smokeless tobacco: Never  Vaping Use   Vaping status: Never Used  Substance Use Topics   Alcohol use: No   Drug use: No     Teddy Sharper, FNP 06/23/24 1638  "

## 2024-06-23 NOTE — ED Triage Notes (Signed)
 Pt c/o congestion, chills/sweats and nausea since last night. No known fever. Vomiting this am.

## 2024-06-23 NOTE — Discharge Instructions (Addendum)
 Advised patient may use Zofran  daily or as needed for nausea.  Encouraged increase daily water intake to 64 ounces per day while taking this medication.  Advised if symptoms worsen and/or unresolved please follow-up with your PCP or here for further evaluation.
